# Patient Record
Sex: Female | Born: 1935 | Race: White | Hispanic: No | Smoking: Former smoker
Health system: Southern US, Community
[De-identification: ages and names within clinical notes are randomized; demographics above are authoritative.]

## PROBLEM LIST (undated history)

## (undated) DIAGNOSIS — H3530 Unspecified macular degeneration: Secondary | ICD-10-CM

## (undated) HISTORY — DX: Unspecified macular degeneration: H35.30

## (undated) HISTORY — PX: OTHER SURGICAL HISTORY: SHX169

---

## 1990-07-21 DIAGNOSIS — H3530 Unspecified macular degeneration: Secondary | ICD-10-CM

## 1990-07-21 HISTORY — DX: Unspecified macular degeneration: H35.30

## 2006-07-21 LAB — HM COLONOSCOPY

## 2010-07-21 LAB — HM MAMMOGRAPHY: HM Mammogram: NORMAL

## 2010-07-21 LAB — HM PAP SMEAR

## 2011-07-09 ENCOUNTER — Observation Stay: Payer: Self-pay | Admitting: Internal Medicine

## 2011-07-28 DIAGNOSIS — H353290 Exudative age-related macular degeneration, unspecified eye, stage unspecified: Secondary | ICD-10-CM | POA: Diagnosis not present

## 2011-08-01 ENCOUNTER — Encounter: Payer: Self-pay | Admitting: Internal Medicine

## 2011-08-01 ENCOUNTER — Ambulatory Visit (INDEPENDENT_AMBULATORY_CARE_PROVIDER_SITE_OTHER): Payer: Medicare Other | Admitting: Internal Medicine

## 2011-08-01 DIAGNOSIS — Z1211 Encounter for screening for malignant neoplasm of colon: Secondary | ICD-10-CM | POA: Diagnosis not present

## 2011-08-01 DIAGNOSIS — I10 Essential (primary) hypertension: Secondary | ICD-10-CM | POA: Diagnosis not present

## 2011-08-01 DIAGNOSIS — Z131 Encounter for screening for diabetes mellitus: Secondary | ICD-10-CM

## 2011-08-01 DIAGNOSIS — Z1239 Encounter for other screening for malignant neoplasm of breast: Secondary | ICD-10-CM

## 2011-08-01 DIAGNOSIS — H3530 Unspecified macular degeneration: Secondary | ICD-10-CM | POA: Insufficient documentation

## 2011-08-01 LAB — MICROALBUMIN / CREATININE URINE RATIO: Microalb, Ur: 1.8 mg/dL (ref 0.0–1.9)

## 2011-08-01 NOTE — Progress Notes (Signed)
Subjective:    Patient ID: Jo Burke, female    DOB: 09/30/1927, 78 y.o.   MRN: [id number]  HPI  Jo Burke is an 78 yo healthy white female recently moved here from [facility] (outside [place], [place]), who presents for primary care.  She was  recently evaluated in ED for  Bronchitis and found to be in hypertensive crisis and was admitted for IV meds.  She was discharged home 4 weeks ago,  Was treated also for bronchitis with steroids, antibiotics (azithromycin) and tussionex.  Seh contineus to feel worn out , which is frustrating to her since she is usually very energetic and active. but slowly getting better.   She has been getting her BP measured  at [facility] it has been labile, as  high as 150, but normalyl 100/60.   Has typically refused influenza bc of prior adverse reaction ("spent a month in bed). Has had the pneumonia vaccine over 13 years ago with repeat.  Prior to hospitalization she exercised regularly.  Past Medical History  Diagnosis Date  . Macular degeneration, left eye 1992    [place],    No current outpatient prescriptions on file prior to visit.   Review of Systems  Constitutional: Positive for fatigue. Negative for fever, chills, appetite change and unexpected weight change.  HENT: Negative for ear pain, congestion, sore throat, trouble swallowing, neck pain, voice change and sinus pressure.   Eyes: Negative for visual disturbance.  Respiratory: Negative for cough, shortness of breath, wheezing and stridor.   Cardiovascular: Negative for chest pain, palpitations and leg swelling.  Gastrointestinal: Negative for nausea, vomiting, abdominal pain, diarrhea, constipation, blood in stool, abdominal distention and anal bleeding.  Genitourinary: Negative for dysuria and flank pain.  Musculoskeletal: Negative for myalgias, arthralgias and gait problem.  Skin: Negative for color change and rash.  Neurological: Positive for weakness. Negative for dizziness  and headaches.  Hematological: Negative for adenopathy. Does not bruise/bleed easily.  Psychiatric/Behavioral: Negative for suicidal ideas, sleep disturbance and dysphoric mood. The patient is not nervous/anxious.        Objective:   Physical Exam  Constitutional: She is oriented to person, place, and time. She appears well-developed and well-nourished.  HENT:  Mouth/Throat: Oropharynx is clear and moist.  Eyes: EOM are normal. Pupils are equal, round, and reactive to light. No scleral icterus.  Neck: Normal range of motion. Neck supple. No JVD present. No thyromegaly present.  Cardiovascular: Normal rate, regular rhythm, normal heart sounds and intact distal pulses.   Pulmonary/Chest: Effort normal and breath sounds normal.  Abdominal: Soft. Bowel sounds are normal. She exhibits no mass. There is no tenderness.  Musculoskeletal: Normal range of motion. She exhibits no edema.  Lymphadenopathy:    She has no cervical adenopathy.  Neurological: She is alert and oriented to person, place, and time.  Skin: Skin is warm and dry.  Psychiatric: She has a normal mood and affect.    BP 158/72  Pulse 72  Temp(Src) 97.6 F (36.4 C) (Oral)  Wt 143 lb (64.864 kg)  SpO2 96%     Assessment & Plan:   Hypertension goal BP (blood pressure) < 140/80 Appears to be new onset, no prior history until less than one month ago.  Will ask her to submit log of weekly bps by [facility] and treat if the majority are > 140/80.  Will avoid hctz and beta blockers given her active lifestyle.   Screening for colon cancer Screening colonoscopy was done at  age 34 and normal.  Will continue annual FOBTS as screenign.   Screening for breast cancer Her last mammogram was Nov 2012 and normal.  Will continue to repeat annually     Updated Medication List No outpatient encounter prescriptions on file as of 08/01/2011.

## 2011-08-01 NOTE — Patient Instructions (Signed)
For frequent constipation, it is oK to take Miralax, Metamucil,  Citrucel or Fibercon daily

## 2011-08-03 ENCOUNTER — Encounter: Payer: Self-pay | Admitting: Internal Medicine

## 2011-08-03 DIAGNOSIS — I10 Essential (primary) hypertension: Secondary | ICD-10-CM | POA: Insufficient documentation

## 2011-08-03 NOTE — Assessment & Plan Note (Signed)
Appears to be new onset, no prior history until less than one month ago.  Will ask her to submit log of weekly bps by [facility] and treat if the majority are > 140/80.  Will avoid hctz and beta blockers given her active lifestyle.

## 2011-08-03 NOTE — Assessment & Plan Note (Signed)
Screening colonoscopy was done at age 78 and normal.  Will continue annual FOBTS as screenign.

## 2011-08-03 NOTE — Assessment & Plan Note (Signed)
Her last mammogram was Nov 2012 and normal.  Will continue to repeat annually

## 2011-09-01 DIAGNOSIS — H353290 Exudative age-related macular degeneration, unspecified eye, stage unspecified: Secondary | ICD-10-CM | POA: Diagnosis not present

## 2011-10-06 DIAGNOSIS — H353290 Exudative age-related macular degeneration, unspecified eye, stage unspecified: Secondary | ICD-10-CM | POA: Diagnosis not present

## 2011-11-14 DIAGNOSIS — H353290 Exudative age-related macular degeneration, unspecified eye, stage unspecified: Secondary | ICD-10-CM | POA: Diagnosis not present

## 2011-12-12 DIAGNOSIS — B351 Tinea unguium: Secondary | ICD-10-CM | POA: Diagnosis not present

## 2011-12-12 DIAGNOSIS — M2040 Other hammer toe(s) (acquired), unspecified foot: Secondary | ICD-10-CM | POA: Diagnosis not present

## 2011-12-22 DIAGNOSIS — H353290 Exudative age-related macular degeneration, unspecified eye, stage unspecified: Secondary | ICD-10-CM | POA: Diagnosis not present

## 2012-01-05 DIAGNOSIS — H169 Unspecified keratitis: Secondary | ICD-10-CM | POA: Diagnosis not present

## 2012-01-12 DIAGNOSIS — H169 Unspecified keratitis: Secondary | ICD-10-CM | POA: Diagnosis not present

## 2012-01-20 DIAGNOSIS — H169 Unspecified keratitis: Secondary | ICD-10-CM | POA: Diagnosis not present

## 2012-02-17 DIAGNOSIS — H3530 Unspecified macular degeneration: Secondary | ICD-10-CM | POA: Diagnosis not present

## 2012-02-22 LAB — HM MAMMOGRAPHY: HM MAMMO: NORMAL

## 2012-02-26 ENCOUNTER — Telehealth: Payer: Self-pay | Admitting: Internal Medicine

## 2012-02-26 DIAGNOSIS — Z1239 Encounter for other screening for malignant neoplasm of breast: Secondary | ICD-10-CM

## 2012-02-26 NOTE — Telephone Encounter (Signed)
Pt came in today to get a mammogram appointment Pt used to go to [facility]  Stated [place] imagine would be ok Pt would like Monday pm appointment

## 2012-02-27 NOTE — Telephone Encounter (Signed)
Order in EPIC 

## 2012-03-04 NOTE — Telephone Encounter (Signed)
Notified patient of her appointment with [facility] she is going to change it to a time that is convenient for her.

## 2012-03-16 DIAGNOSIS — Z1231 Encounter for screening mammogram for malignant neoplasm of breast: Secondary | ICD-10-CM | POA: Diagnosis not present

## 2012-03-25 ENCOUNTER — Encounter: Payer: Self-pay | Admitting: Internal Medicine

## 2012-04-02 DIAGNOSIS — H43819 Vitreous degeneration, unspecified eye: Secondary | ICD-10-CM | POA: Diagnosis not present

## 2012-04-02 DIAGNOSIS — H353290 Exudative age-related macular degeneration, unspecified eye, stage unspecified: Secondary | ICD-10-CM | POA: Diagnosis not present

## 2012-05-14 DIAGNOSIS — H353290 Exudative age-related macular degeneration, unspecified eye, stage unspecified: Secondary | ICD-10-CM | POA: Diagnosis not present

## 2012-06-07 DIAGNOSIS — Z23 Encounter for immunization: Secondary | ICD-10-CM | POA: Diagnosis not present

## 2012-06-23 ENCOUNTER — Ambulatory Visit (INDEPENDENT_AMBULATORY_CARE_PROVIDER_SITE_OTHER): Payer: Medicare Other | Admitting: Internal Medicine

## 2012-06-23 ENCOUNTER — Other Ambulatory Visit: Payer: Self-pay

## 2012-06-23 ENCOUNTER — Encounter: Payer: Self-pay | Admitting: Internal Medicine

## 2012-06-23 VITALS — BP 152/70 | HR 65 | Temp 98.1°F | Resp 12 | Ht 61.5 in | Wt 149.5 lb

## 2012-06-23 DIAGNOSIS — E559 Vitamin D deficiency, unspecified: Secondary | ICD-10-CM

## 2012-06-23 DIAGNOSIS — R5381 Other malaise: Secondary | ICD-10-CM

## 2012-06-23 DIAGNOSIS — Z13220 Encounter for screening for lipoid disorders: Secondary | ICD-10-CM

## 2012-06-23 DIAGNOSIS — R32 Unspecified urinary incontinence: Secondary | ICD-10-CM

## 2012-06-23 DIAGNOSIS — Z1331 Encounter for screening for depression: Secondary | ICD-10-CM

## 2012-06-23 DIAGNOSIS — E785 Hyperlipidemia, unspecified: Secondary | ICD-10-CM

## 2012-06-23 DIAGNOSIS — R350 Frequency of micturition: Secondary | ICD-10-CM

## 2012-06-23 DIAGNOSIS — Z0000 Encounter for general adult medical examination without abnormal findings: Secondary | ICD-10-CM | POA: Diagnosis not present

## 2012-06-23 DIAGNOSIS — I10 Essential (primary) hypertension: Secondary | ICD-10-CM | POA: Diagnosis not present

## 2012-06-23 DIAGNOSIS — R5383 Other fatigue: Secondary | ICD-10-CM | POA: Diagnosis not present

## 2012-06-23 DIAGNOSIS — Z124 Encounter for screening for malignant neoplasm of cervix: Secondary | ICD-10-CM

## 2012-06-23 LAB — LIPID PANEL
Total CHOL/HDL Ratio: 4
VLDL: 16.4 mg/dL (ref 0.0–40.0)

## 2012-06-23 LAB — CBC WITH DIFFERENTIAL/PLATELET
Basophils Absolute: 0 10*3/uL (ref 0.0–0.1)
Basophils Relative: 0.6 % (ref 0.0–3.0)
Eosinophils Relative: 3.3 % (ref 0.0–5.0)
HCT: 39.8 % (ref 36.0–46.0)
Hemoglobin: 13 g/dL (ref 12.0–15.0)
Lymphocytes Relative: 28.7 % (ref 12.0–46.0)
Lymphs Abs: 1.4 10*3/uL (ref 0.7–4.0)
Monocytes Relative: 11.6 % (ref 3.0–12.0)
Neutro Abs: 2.7 10*3/uL (ref 1.4–7.7)
RBC: 4.45 Mil/uL (ref 3.87–5.11)
RDW: 14.7 % — ABNORMAL HIGH (ref 11.5–14.6)
WBC: 4.8 10*3/uL (ref 4.5–10.5)

## 2012-06-23 LAB — POCT URINALYSIS DIPSTICK
Ketones, UA: NEGATIVE
Protein, UA: NEGATIVE
pH, UA: 5.5

## 2012-06-23 LAB — COMPREHENSIVE METABOLIC PANEL
ALT: 19 U/L (ref 0–35)
CO2: 25 mEq/L (ref 19–32)
Calcium: 9.1 mg/dL (ref 8.4–10.5)
Chloride: 105 mEq/L (ref 96–112)
Creatinine, Ser: 0.9 mg/dL (ref 0.4–1.2)
GFR: 63.65 mL/min (ref >60.00)
Glucose, Bld: 83 mg/dL (ref 70–99)

## 2012-06-23 LAB — TSH: TSH: 0.97 u[IU]/mL (ref 0.35–5.50)

## 2012-06-23 MED ORDER — TETANUS-DIPHTH-ACELL PERTUSSIS 5-2.5-18.5 LF-MCG/0.5 IM SUSP: 0.5000 mL | Freq: Once | INTRAMUSCULAR | Status: DC

## 2012-06-23 MED ORDER — DARIFENACIN HYDROBROMIDE ER 7.5 MG PO TB24: 7.5000 mg | ORAL_TABLET | Freq: Every day | ORAL | Status: DC

## 2012-06-23 NOTE — Progress Notes (Addendum)
Patient ID: Jo Burke, female   DOB: 11-Nov-1927, 78 y.o.   MRN: [id number]  The patient is here for annual Medicare wellness examination and management of other chronic and acute problems.   The risk factors are reflected in the social history.  The roster of all physicians providing medical care to patient - is listed in the Snapshot section of the chart.  Activities of daily living:  The patient is 100% independent in all ADLs: dressing, toileting, feeding as well as independent mobility  Home safety : The patient has smoke detectors in the home. They wear seatbelts.  There are no firearms at home. There is no violence in the home.   There is no risks for hepatitis, STDs or HIV. There is no   history of blood transfusion. They have no travel history to infectious disease endemic areas of the world.  The patient has seen their dentist in the last six month. They have seen their eye doctor in the last year. They admit to slight hearing difficulty with regard to whispered voices and some television programs.  They have deferred audiologic testing in the last year.  They do not  have excessive sun exposure. Discussed the need for sun protection: hats, long sleeves and use of sunscreen if there is significant sun exposure.   Diet: the importance of a healthy diet is discussed. They do have a healthy diet.  The benefits of regular aerobic exercise were discussed. She walks 4 times per week ,  20 minutes.   Depression screen: there are no signs or vegative symptoms of depression- irritability, change in appetite, anhedonia, sadness/tearfullness.  Cognitive assessment: the patient manages all their financial and personal affairs and is actively engaged. They could relate day,date,year and events; recalled 2/3 objects at 3 minutes; performed clock-face test normally.  The following portions of the patient's history were reviewed and updated as appropriate: allergies, current medications, past  family history, past medical history,  past surgical history, past social history  and problem list.  Visual acuity was not assessed per patient preference since she has regular follow up with her ophthalmologist. Hearing and body mass index were assessed and reviewed.   During the course of the visit the patient was educated and counseled about appropriate screening and preventive services including : fall prevention , diabetes screening, nutrition counseling, colorectal cancer screening, and recommended immunizations.    Objective  BP 152/70  Pulse 65  Temp 98.1 F (36.7 C) (Oral)  Resp 12  Ht 5' 1.5" (1.562 m)  Wt 149 lb 8 oz (67.813 kg)  BMI 27.79 kg/m2  SpO2 95%  General Appearance:    Alert, cooperative, no distress, appears stated age  Head:    Normocephalic, without obvious abnormality, atraumatic  Eyes:    PERRL, conjunctiva/corneas clear, EOM's intact, fundi    benign, both eyes  Ears:    Normal TM's and external ear canals, both ears  Nose:   Nares normal, septum midline, mucosa normal, no drainage    or sinus tenderness  Throat:   Lips, mucosa, and tongue normal; teeth and gums normal  Neck:   Supple, symmetrical, trachea midline, no adenopathy;    thyroid:  no enlargement/tenderness/nodules; no carotid   bruit or JVD  Back:     Symmetric, no curvature, ROM normal, no CVA tenderness  Lungs:     Clear to auscultation bilaterally, respirations unlabored  Chest Wall:    No tenderness or deformity   Heart:    Regular  rate and rhythm, S1 and S2 normal, no murmur, rub   or gallop  Breast Exam:    No tenderness, masses, or nipple abnormality  Abdomen:     Soft, non-tender, bowel sounds active all four quadrants,    no masses, no organomegaly  Genitalia:    Normal female without lesion, discharge or tenderness  Extremities:   Extremities normal, atraumatic, no cyanosis or edema  Pulses:   2+ and symmetric all extremities  Skin:   Skin color, texture, turgor normal, no  rashes or lesions  Lymph nodes:   Cervical, supraclavicular, and axillary nodes normal  Neurologic:   CNII-XII intact, normal strength, sensation and reflexes    throughout    Assessment and Plan  Routine general medical examination at a health care facility Breast and pelvic exams were done today and WNL.   Hypertension goal BP (blood pressure) < 140/80 Well controlled historically without medications,  Given her age.   EKG normal except for mild LAE suggested.   Other and unspecified hyperlipidemia She has simply hyperlipidemia, LDl of 200, with no history of DM or CAD .  Will discuss statin therapy with her,  Trial of pravastatin  And baby aspirin.Marland Kitchen  Urinary incontinence in female No UTI by current testing,  Trial of enablex.  Samples give,  Side effects discussed.   Vitamin D deficiency Low at  89.  Rx Drisdol for winter months    Updated Medication List Outpatient Encounter Prescriptions as of 06/23/2012  Medication Sig Dispense Refill  . aspirin 81 MG EC tablet Take 1 tablet (81 mg total) by mouth daily. Swallow whole.  30 tablet  12  . darifenacin (ENABLEX) 7.5 MG 24 hr tablet Take 1 tablet (7.5 mg total) by mouth daily.  14 tablet  0  . ergocalciferol (DRISDOL) 50000 UNITS capsule Take 1 capsule (50,000 Units total) by mouth once a week.  4 capsule  3  . pravastatin (PRAVACHOL) 20 MG tablet Take 1 tablet (20 mg total) by mouth daily.  90 tablet  3  . TDaP (BOOSTRIX) 5-2.5-18.5 LF-MCG/0.5 injection Inject 0.5 mLs into the muscle once.  0.5 mL  0

## 2012-06-23 NOTE — Patient Instructions (Addendum)
Your exam was normal  I recommend you get the TDap vaccine at one of the local pharmacies   I gave you samples of enablex,  Take one tablet daily for bladder problems.  We will call  you with the results of your labs

## 2012-06-25 ENCOUNTER — Encounter: Payer: Self-pay | Admitting: Internal Medicine

## 2012-06-25 DIAGNOSIS — E785 Hyperlipidemia, unspecified: Secondary | ICD-10-CM | POA: Insufficient documentation

## 2012-06-25 DIAGNOSIS — E559 Vitamin D deficiency, unspecified: Secondary | ICD-10-CM | POA: Insufficient documentation

## 2012-06-25 DIAGNOSIS — R32 Unspecified urinary incontinence: Secondary | ICD-10-CM | POA: Insufficient documentation

## 2012-06-25 MED ORDER — ASPIRIN 81 MG PO TBEC: 81.0000 mg | DELAYED_RELEASE_TABLET | Freq: Every day | ORAL | Status: DC

## 2012-06-25 MED ORDER — ERGOCALCIFEROL 1.25 MG (50000 UT) PO CAPS: 50000.0000 [IU] | ORAL_CAPSULE | ORAL | Status: DC

## 2012-06-25 MED ORDER — PRAVASTATIN SODIUM 20 MG PO TABS: 20.0000 mg | ORAL_TABLET | Freq: Every day | ORAL | Status: DC

## 2012-06-25 NOTE — Assessment & Plan Note (Signed)
Low at  21.  Rx Drisdol for winter months

## 2012-06-25 NOTE — Addendum Note (Signed)
Addended by: Sherlene Shams on: 06/25/2012 05:49 AM   Modules accepted: Orders

## 2012-06-25 NOTE — Assessment & Plan Note (Signed)
No UTI by current testing,  Trial of enablex.  Samples give,  Side effects discussed.

## 2012-06-25 NOTE — Assessment & Plan Note (Addendum)
She has simply hyperlipidemia, LDl of 200, with no history of DM or CAD .  Will discuss statin therapy with her,  Trial of pravastatin  And baby aspirin.Marland Kitchen

## 2012-06-25 NOTE — Assessment & Plan Note (Addendum)
Well controlled historically without medications,  Given her age.   EKG normal except for mild LAE suggested.

## 2012-06-25 NOTE — Assessment & Plan Note (Signed)
Breast and pelvic exams were done today and WNL.

## 2012-06-28 DIAGNOSIS — H353290 Exudative age-related macular degeneration, unspecified eye, stage unspecified: Secondary | ICD-10-CM | POA: Diagnosis not present

## 2012-08-09 DIAGNOSIS — H353290 Exudative age-related macular degeneration, unspecified eye, stage unspecified: Secondary | ICD-10-CM | POA: Diagnosis not present

## 2012-08-11 ENCOUNTER — Telehealth: Payer: Self-pay | Admitting: General Practice

## 2012-08-11 NOTE — Telephone Encounter (Signed)
Pt wants to primidone. Pt states that the tremors of her hands is getting to the point where she needs a medication. Years ago the neurologist had diagnosed her and stated this would be a good medicine. Pt wants your opinion of medication.

## 2012-08-12 NOTE — Telephone Encounter (Signed)
pretty safe, well tolerated.  Worth a try

## 2012-08-13 NOTE — Telephone Encounter (Signed)
Called pt and left message informing her

## 2012-08-13 NOTE — Telephone Encounter (Signed)
i have never evaluated her for tremor and cannot prescribe it as requested until I see her. .  Asking for my opinion is different than asking me to prescribe it. Neither of my tow office notes reference a tremor so I would be liable if she had an adverse reaction to it, since I have not documented a tremor.

## 2012-08-13 NOTE — Telephone Encounter (Signed)
Pt would like this medication sent to [facility] in [place] on [address].

## 2012-09-05 ENCOUNTER — Other Ambulatory Visit: Payer: Self-pay

## 2012-09-09 ENCOUNTER — Telehealth: Payer: Self-pay | Admitting: Internal Medicine

## 2012-09-09 MED ORDER — BENZONATATE 100 MG PO CAPS: 100.0000 mg | ORAL_CAPSULE | Freq: Two times a day (BID) | ORAL | Status: DC | PRN

## 2012-09-09 NOTE — Telephone Encounter (Signed)
Jo Burke, Independent Living Nurse at [facility] states Pt came in yesterday complaining of hacky cough that has been there for about two weeks. She is not running a fever, she is not SOB or anything, lung sounds clear.  When she does cough up stuff it is mostly white.  Jo Burke can't see reason for pt to be on antibiotics at this point but wants to let Dr. Darrick Huntsman know she is going to take cepacol lozenges.  Wondering if Dr. Darrick Huntsman would recommend anything for Jo Burke.

## 2012-09-09 NOTE — Telephone Encounter (Signed)
Tessalon sent to pharmacy   For cough

## 2012-09-10 NOTE — Telephone Encounter (Signed)
Pt and [place] Notified.

## 2012-09-19 DIAGNOSIS — H353290 Exudative age-related macular degeneration, unspecified eye, stage unspecified: Secondary | ICD-10-CM | POA: Diagnosis not present

## 2012-09-20 DIAGNOSIS — H353290 Exudative age-related macular degeneration, unspecified eye, stage unspecified: Secondary | ICD-10-CM | POA: Diagnosis not present

## 2012-11-01 DIAGNOSIS — H353290 Exudative age-related macular degeneration, unspecified eye, stage unspecified: Secondary | ICD-10-CM | POA: Diagnosis not present

## 2012-12-06 DIAGNOSIS — H353290 Exudative age-related macular degeneration, unspecified eye, stage unspecified: Secondary | ICD-10-CM | POA: Diagnosis not present

## 2012-12-29 DIAGNOSIS — H353290 Exudative age-related macular degeneration, unspecified eye, stage unspecified: Secondary | ICD-10-CM | POA: Diagnosis not present

## 2013-02-21 DIAGNOSIS — H353290 Exudative age-related macular degeneration, unspecified eye, stage unspecified: Secondary | ICD-10-CM | POA: Diagnosis not present

## 2013-02-23 ENCOUNTER — Other Ambulatory Visit: Payer: Self-pay

## 2013-03-28 DIAGNOSIS — H353290 Exudative age-related macular degeneration, unspecified eye, stage unspecified: Secondary | ICD-10-CM | POA: Diagnosis not present

## 2013-05-16 DIAGNOSIS — H353190 Nonexudative age-related macular degeneration, unspecified eye, stage unspecified: Secondary | ICD-10-CM | POA: Diagnosis not present

## 2013-05-26 ENCOUNTER — Other Ambulatory Visit: Payer: Self-pay

## 2013-06-19 ENCOUNTER — Other Ambulatory Visit: Payer: Self-pay | Admitting: Internal Medicine

## 2013-06-30 DIAGNOSIS — C44319 Basal cell carcinoma of skin of other parts of face: Secondary | ICD-10-CM | POA: Diagnosis not present

## 2013-07-11 ENCOUNTER — Telehealth: Payer: Self-pay | Admitting: Internal Medicine

## 2013-07-11 NOTE — Telephone Encounter (Signed)
Needs to be seen

## 2013-07-11 NOTE — Telephone Encounter (Signed)
The patient thinks she is coming down with the flu and she is wanting a prescription for Tamiflu.

## 2013-07-12 ENCOUNTER — Telehealth: Payer: Self-pay | Admitting: *Deleted

## 2013-07-12 NOTE — Telephone Encounter (Signed)
Dr. Melina Schools nurse Olegario Messier voicemail also checked and there was no message from patient on her voicemail either. Not sure what message or voicemail patient was speaking of.

## 2013-07-12 NOTE — Telephone Encounter (Signed)
Patient walked in without an appointment after declining appointment time this morning per our conversation. Patient came in the office demanding to be seen however no one had any openings to see patient. I returned patient's call this morning to offer her an appointment for today with Dr. Dan Humphreys per our previous phone conversation. She stated she wanted to check with the nurse at her facility first before coming in to be seen. Per patient she called back but did not speak to anyone and was waiting for someone to call her back to schedule the appointment. I had  no messages on my voicemail from this patient. Explained to patient if she was calling to get an appointment then she should have spoken to someone. Patient was very loud, rude and argumentative while in the office. Informed her she can be seen tomorrow just schedule an appointment with the front desk before leaving.

## 2013-07-12 NOTE — Telephone Encounter (Signed)
Offered patient an appointment to be seen today, she declined. Stated she would call back

## 2013-07-13 ENCOUNTER — Ambulatory Visit: Payer: Medicare Other | Admitting: Adult Health

## 2013-07-13 ENCOUNTER — Encounter: Payer: Self-pay | Admitting: Internal Medicine

## 2013-07-13 ENCOUNTER — Ambulatory Visit (INDEPENDENT_AMBULATORY_CARE_PROVIDER_SITE_OTHER): Payer: Medicare Other | Admitting: Internal Medicine

## 2013-07-13 VITALS — BP 158/80 | HR 66 | Temp 97.6°F | Wt 140.0 lb

## 2013-07-13 DIAGNOSIS — E559 Vitamin D deficiency, unspecified: Secondary | ICD-10-CM | POA: Diagnosis not present

## 2013-07-13 DIAGNOSIS — J069 Acute upper respiratory infection, unspecified: Secondary | ICD-10-CM | POA: Diagnosis not present

## 2013-07-13 LAB — CBC WITH DIFFERENTIAL/PLATELET
Basophils Absolute: 0 10*3/uL (ref 0.0–0.1)
Eosinophils Absolute: 0.2 10*3/uL (ref 0.0–0.7)
Eosinophils Relative: 3.3 % (ref 0.0–5.0)
HCT: 40.1 % (ref 36.0–46.0)
Lymphocytes Relative: 31.2 % (ref 12.0–46.0)
Lymphs Abs: 1.6 10*3/uL (ref 0.7–4.0)
MCHC: 33.5 g/dL (ref 30.0–36.0)
MCV: 88.7 fl (ref 78.0–100.0)
Monocytes Absolute: 0.5 10*3/uL (ref 0.1–1.0)
Neutrophils Relative %: 54.7 % (ref 43.0–77.0)
Platelets: 240 10*3/uL (ref 150.0–400.0)
RDW: 14.6 % (ref 11.5–14.6)

## 2013-07-13 LAB — COMPREHENSIVE METABOLIC PANEL
ALT: 18 U/L (ref 0–35)
AST: 19 U/L (ref 0–37)
Albumin: 4.1 g/dL (ref 3.5–5.2)
Alkaline Phosphatase: 64 U/L (ref 39–117)
GFR: 73.79 mL/min (ref >60.00)
Potassium: 4.2 mEq/L (ref 3.5–5.1)
Sodium: 138 mEq/L (ref 135–145)
Total Protein: 7 g/dL (ref 6.0–8.3)

## 2013-07-13 LAB — POCT INFLUENZA A/B: Influenza A, POC: NEGATIVE

## 2013-07-13 NOTE — Assessment & Plan Note (Signed)
Will recheck Vit D level with labs today. 

## 2013-07-13 NOTE — Progress Notes (Signed)
Pre-visit discussion using our clinic review tool. No additional management support is needed unless otherwise documented below in the visit note.  

## 2013-07-13 NOTE — Patient Instructions (Signed)

## 2013-07-13 NOTE — Progress Notes (Signed)
   Subjective:    Patient ID: Jo Burke, female    DOB: March 11, 1928, 79 y.o.   MRN: [id number]  HPI 79YO female presents for acute visit with headache, fatigue, general malaise x 3-4 days. No fever, chills. BP yesterday 118/60. No nasal congestion, sore throat. Occasional dry cough, very minimal. Did not have flu shot this year.  Outpatient Encounter Prescriptions as of 07/13/2013  Medication Sig  . naproxen sodium (ANAPROX) 220 MG tablet Take 220 mg by mouth 2 (two) times daily with a meal.  . aspirin 81 MG EC tablet Take 1 tablet (81 mg total) by mouth daily. Swallow whole.  . darifenacin (ENABLEX) 7.5 MG 24 hr tablet Take 1 tablet (7.5 mg total) by mouth daily.  . ergocalciferol (DRISDOL) 50000 UNITS capsule Take 1 capsule (50,000 Units total) by mouth once a week.  . pravastatin (PRAVACHOL) 20 MG tablet Take 1 tablet (20 mg total) by mouth daily.   BP 158/80  Pulse 66  Temp(Src) 97.6 F (36.4 C) (Oral)  Wt 140 lb (63.504 kg)  SpO2 96%    Review of Systems  Constitutional: Positive for fatigue. Negative for fever, chills and unexpected weight change.  HENT: Negative for congestion, ear discharge, ear pain, facial swelling, hearing loss, mouth sores, nosebleeds, postnasal drip, rhinorrhea, sinus pressure, sneezing, sore throat, tinnitus, trouble swallowing and voice change.   Eyes: Negative for pain, discharge, redness and visual disturbance.  Respiratory: Positive for cough. Negative for chest tightness, shortness of breath, wheezing and stridor.   Cardiovascular: Negative for chest pain, palpitations and leg swelling.  Musculoskeletal: Negative for arthralgias, myalgias, neck pain and neck stiffness.  Skin: Negative for color change and rash.  Neurological: Negative for dizziness, weakness, light-headedness and headaches.  Hematological: Negative for adenopathy.       Objective:   Physical Exam  Constitutional: She is oriented to person, place, and time. She appears  well-developed and well-nourished. No distress.  HENT:  Head: Normocephalic and atraumatic.  Right Ear: External ear normal.  Left Ear: External ear normal.  Nose: Nose normal.  Mouth/Throat: Oropharynx is clear and moist. No oropharyngeal exudate.  Eyes: Conjunctivae are normal. Pupils are equal, round, and reactive to light. Right eye exhibits no discharge. Left eye exhibits no discharge. No scleral icterus.  Neck: Normal range of motion. Neck supple. No tracheal deviation present. No thyromegaly present.  Cardiovascular: Normal rate, regular rhythm, normal heart sounds and intact distal pulses.  Exam reveals no gallop and no friction rub.   No murmur heard. Pulmonary/Chest: Effort normal and breath sounds normal. No accessory muscle usage. Not tachypneic. No respiratory distress. She has no decreased breath sounds. She has no wheezes. She has no rhonchi. She has no rales. She exhibits no tenderness.  Musculoskeletal: Normal range of motion. She exhibits no edema and no tenderness.  Lymphadenopathy:    She has no cervical adenopathy.  Neurological: She is alert and oriented to person, place, and time. No cranial nerve deficit. She exhibits normal muscle tone. Coordination normal.  Skin: Skin is warm and dry. No rash noted. She is not diaphoretic. No erythema. No pallor.  Psychiatric: She has a normal mood and affect. Her behavior is normal. Judgment and thought content normal.          Assessment & Plan:

## 2013-07-13 NOTE — Assessment & Plan Note (Signed)
Symptoms most consistent with viral URI. Rapid flu negative. Encouraged rest, adequate fluid intake. Tylenol prn fever or pain. Follow up if symptoms not improving or if any worsening fever, chills, cough, shortness of breath.

## 2013-07-25 ENCOUNTER — Telehealth: Payer: Self-pay | Admitting: *Deleted

## 2013-07-25 NOTE — Telephone Encounter (Signed)
All labs are normal except vitamin d is slightly low, pleaseincrease vitmain d supplement to 2000 units daily for the winter months.   print out and mail labs to her

## 2013-07-25 NOTE — Telephone Encounter (Signed)
Patient left a message on voicemail that she is not able to review her labs on my chart and wants the results called to her. Please advise.

## 2013-07-26 NOTE — Telephone Encounter (Signed)
Patient notified of results.

## 2013-07-26 NOTE — Telephone Encounter (Signed)
Left message for patient to return my call.

## 2013-07-27 ENCOUNTER — Encounter: Payer: Medicare Other | Admitting: Internal Medicine

## 2013-08-01 ENCOUNTER — Telehealth: Payer: Self-pay | Admitting: Internal Medicine

## 2013-08-01 NOTE — Telephone Encounter (Signed)
The patient is wanting her recent labs mailed to her home. She has deactivated her my chart

## 2013-08-02 NOTE — Telephone Encounter (Signed)
Mailed results as requested.

## 2013-08-15 DIAGNOSIS — H353290 Exudative age-related macular degeneration, unspecified eye, stage unspecified: Secondary | ICD-10-CM | POA: Diagnosis not present

## 2013-09-26 DIAGNOSIS — C44319 Basal cell carcinoma of skin of other parts of face: Secondary | ICD-10-CM | POA: Diagnosis not present

## 2013-09-26 DIAGNOSIS — Z85828 Personal history of other malignant neoplasm of skin: Secondary | ICD-10-CM | POA: Insufficient documentation

## 2013-11-07 ENCOUNTER — Telehealth: Payer: Self-pay | Admitting: Internal Medicine

## 2013-11-07 NOTE — Telephone Encounter (Signed)
I have not seen this patient since December 2013.  She must be seen before an referral for PT can be done.

## 2013-11-07 NOTE — Telephone Encounter (Signed)
Left message for patient to return call to office. 

## 2013-11-07 NOTE — Telephone Encounter (Signed)
Please advise 

## 2013-11-07 NOTE — Telephone Encounter (Signed)
The patient is needing a prescription sent to  [facility] to work on her sciatic nerve.

## 2013-11-08 NOTE — Telephone Encounter (Signed)
Jo Burke scheduled for 11/18/13 for referral to PT.

## 2013-11-14 DIAGNOSIS — H353290 Exudative age-related macular degeneration, unspecified eye, stage unspecified: Secondary | ICD-10-CM | POA: Diagnosis not present

## 2013-11-18 ENCOUNTER — Ambulatory Visit: Payer: Medicare Other | Admitting: Internal Medicine

## 2013-11-30 DIAGNOSIS — H3530 Unspecified macular degeneration: Secondary | ICD-10-CM | POA: Diagnosis not present

## 2013-12-08 ENCOUNTER — Encounter: Payer: Self-pay | Admitting: Internal Medicine

## 2013-12-21 ENCOUNTER — Telehealth: Payer: Self-pay

## 2013-12-21 ENCOUNTER — Encounter: Payer: Self-pay | Admitting: Internal Medicine

## 2013-12-21 ENCOUNTER — Ambulatory Visit (INDEPENDENT_AMBULATORY_CARE_PROVIDER_SITE_OTHER): Payer: Medicare Other | Admitting: Internal Medicine

## 2013-12-21 VITALS — BP 112/62 | HR 70 | Temp 97.9°F | Resp 16 | Ht 61.0 in | Wt 138.5 lb

## 2013-12-21 DIAGNOSIS — I10 Essential (primary) hypertension: Secondary | ICD-10-CM

## 2013-12-21 DIAGNOSIS — R5381 Other malaise: Secondary | ICD-10-CM | POA: Diagnosis not present

## 2013-12-21 DIAGNOSIS — R498 Other voice and resonance disorders: Secondary | ICD-10-CM | POA: Diagnosis not present

## 2013-12-21 DIAGNOSIS — Z0000 Encounter for general adult medical examination without abnormal findings: Secondary | ICD-10-CM

## 2013-12-21 DIAGNOSIS — Z1211 Encounter for screening for malignant neoplasm of colon: Secondary | ICD-10-CM

## 2013-12-21 DIAGNOSIS — E559 Vitamin D deficiency, unspecified: Secondary | ICD-10-CM | POA: Diagnosis not present

## 2013-12-21 DIAGNOSIS — Z23 Encounter for immunization: Secondary | ICD-10-CM

## 2013-12-21 DIAGNOSIS — R490 Dysphonia: Secondary | ICD-10-CM

## 2013-12-21 DIAGNOSIS — R5383 Other fatigue: Secondary | ICD-10-CM | POA: Diagnosis not present

## 2013-12-21 DIAGNOSIS — Z1239 Encounter for other screening for malignant neoplasm of breast: Secondary | ICD-10-CM

## 2013-12-21 DIAGNOSIS — H3530 Unspecified macular degeneration: Secondary | ICD-10-CM

## 2013-12-21 DIAGNOSIS — R499 Unspecified voice and resonance disorder: Secondary | ICD-10-CM

## 2013-12-21 DIAGNOSIS — E785 Hyperlipidemia, unspecified: Secondary | ICD-10-CM

## 2013-12-21 LAB — COMPREHENSIVE METABOLIC PANEL
ALT: 17 U/L (ref 0–35)
AST: 19 U/L (ref 0–37)
Albumin: 3.8 g/dL (ref 3.5–5.2)
Alkaline Phosphatase: 58 U/L (ref 39–117)
BILIRUBIN TOTAL: 1 mg/dL (ref 0.2–1.2)
BUN: 23 mg/dL (ref 6–23)
CO2: 28 mEq/L (ref 19–32)
Calcium: 9.7 mg/dL (ref 8.4–10.5)
Chloride: 103 mEq/L (ref 96–112)
Creatinine, Ser: 0.8 mg/dL (ref 0.4–1.2)
GFR: 74.81 mL/min (ref >60.00)
Glucose, Bld: 82 mg/dL (ref 70–99)
Potassium: 4.7 mEq/L (ref 3.5–5.1)
Sodium: 137 mEq/L (ref 135–145)
TOTAL PROTEIN: 6.6 g/dL (ref 6.0–8.3)

## 2013-12-21 LAB — CBC WITH DIFFERENTIAL/PLATELET
Basophils Absolute: 0 10*3/uL (ref 0.0–0.1)
Basophils Relative: 0.4 % (ref 0.0–3.0)
EOS PCT: 2.7 % (ref 0.0–5.0)
Eosinophils Absolute: 0.2 10*3/uL (ref 0.0–0.7)
HEMATOCRIT: 40.7 % (ref 36.0–46.0)
Hemoglobin: 13.5 g/dL (ref 12.0–15.0)
LYMPHS ABS: 1.4 10*3/uL (ref 0.7–4.0)
Lymphocytes Relative: 23.8 % (ref 12.0–46.0)
MCHC: 33.2 g/dL (ref 30.0–36.0)
MCV: 90.2 fl (ref 78.0–100.0)
MONO ABS: 0.5 10*3/uL (ref 0.1–1.0)
Monocytes Relative: 9 % (ref 3.0–12.0)
Neutro Abs: 3.8 10*3/uL (ref 1.4–7.7)
Neutrophils Relative %: 64.1 % (ref 43.0–77.0)
PLATELETS: 226 10*3/uL (ref 150.0–400.0)
RBC: 4.52 Mil/uL (ref 3.87–5.11)
RDW: 14 % (ref 11.5–15.5)
WBC: 5.9 10*3/uL (ref 4.0–10.5)

## 2013-12-21 MED ORDER — TETANUS-DIPHTH-ACELL PERTUSSIS 5-2.5-18.5 LF-MCG/0.5 IM SUSP: 0.5000 mL | Freq: Once | INTRAMUSCULAR | Status: DC

## 2013-12-21 NOTE — Progress Notes (Signed)
Patient ID: Jo Burke, female   DOB: 1928/03/06, 80 y.o.   MRN: [id number]  The patient is here for annual Medicare wellness examination and management of other chronic and acute problems.  She has been less [place] physically and socially because her husband Jo Burke has dementia,  Although he is still functional.    She is having insomnia due to anxiety.  Falls asleep in front of TV, but when she goes to bed she can't sleep.  She is  averaging 5 hours.,  Does not nap during the day, and does not want medication.   Recurrent episodes of sciatica have been  relieved with quinine and ibuprofen    The risk factors are reflected in the social history.  The roster of all physicians providing medical care to patient - is listed in the Snapshot section of the chart.  Activities of daily living:  The patient is 100% independent in all ADLs: dressing, toileting, feeding as well as independent mobility  Home safety : The patient has smoke detectors in the home. They wear seatbelts.  There are no firearms at home. There is no violence in the home.   There is no risks for hepatitis, STDs or HIV. There is no   history of blood transfusion. They have no travel history to infectious disease endemic areas of the world.  The patient has seen their dentist in the last six month. They have seen their eye doctor in the last year. They admit to slight hearing difficulty with regard to whispered voices and some television programs.  They have deferred audiologic testing in the last year.  They do not  have excessive sun exposure. Discussed the need for sun protection: hats, long sleeves and use of sunscreen if there is significant sun exposure.   Diet: the importance of a healthy diet is discussed. They do have a healthy diet.  The benefits of regular aerobic exercise were discussed. She walks 4 times per week ,  20 minutes water aerobics 3/weekand does yoga at [facility] 2/week  Depression screen: there are no  signs or vegative symptoms of depression- irritability, change in appetite, anhedonia, sadness/tearfullness.  Cognitive assessment: the patient manages all their financial and personal affairs and is actively engaged. They could relate day,date,year and events; recalled 2/3 objects at 3 minutes; performed clock-face test normally.  The following portions of the patient's history were reviewed and updated as appropriate: allergies, current medications, past family history, past medical history,  past surgical history, past social history  and problem list.  Visual acuity was not assessed per patient preference since she has regular follow up with her ophthalmologist.  Has macular degeneration and corneal atrophy managed by [facility] . Hearing and body mass index were assessed and reviewed.   During the course of the visit the patient was educated and counseled about appropriate screening and preventive services including : fall prevention , diabetes screening, nutrition counseling, colorectal cancer screening, and recommended immunizations.    Objective: BP 112/62  Pulse 70  Temp(Src) 97.9 F (36.6 C) (Oral)  Resp 16  Ht 5\' 1"  (1.549 m)  Wt 138 lb 8 oz (62.823 kg)  BMI 26.18 kg/m2  SpO2 98%  General appearance: alert, cooperative and appears stated age Head: Normocephalic, without obvious abnormality, atraumatic Eyes: conjunctivae/corneas clear. PERRL, EOM's intact. Fundi benign. Ears: normal TM's and external ear canals both ears Nose: Nares normal. Septum midline. Mucosa normal. No drainage or sinus tenderness. Throat: lips, mucosa, and tongue normal; teeth  and gums normal Neck: no adenopathy, no carotid bruit, no JVD, supple, symmetrical, trachea midline and thyroid not enlarged, symmetric, no tenderness/mass/nodules Lungs: clear to auscultation bilaterally Breasts: normal appearance, no masses or tenderness Heart: regular rate and rhythm, S1, S2 normal, no murmur, click, rub or  gallop Abdomen: soft, non-tender; bowel sounds normal; no masses,  no organomegaly Extremities: extremities normal, atraumatic, no cyanosis or edema Pulses: 2+ and symmetric Skin: Skin color, texture, turgor normal. No rashes or lesions Neurologic: Alert and oriented X 3, normal strength and tone. Normal symmetric reflexes. Normal coordination and gait.   Assessment  and Plan:   Routine general medical examination at a health care facility Annual comprehensive exam was done including breast exam .  Pelvic exam was done in 2013 and was deferred per patient request today. All screenings have been addressed .   Hypertension goal BP (blood pressure) < 140/80 Well controlled historically without medications,  Given her age.   Baseline EKG normal except for mild LAE suggested.     Vitamin D deficiency Treated with megadose for several weeks,  Repeat  Vit D level was normal today.   Macular degeneration, left eye She remains able to drive . Follow up with [facility] is anticipated but she is apprehensive about Dr Appenzeller's replacement  Other and unspecified hyperlipidemia She was prescribed pravastatin Dec  2013 for LDL > 160 but declined therapy due to her age.   Hoarseness or changing voice She has a history of vocal cord polyps which were removed years ago, but has noticed a change in her voice and occasional coughing with think liquids.  Referral to ENT for evaluation of vocal cords   Updated Medication List Outpatient Encounter Prescriptions as of 12/21/2013  Medication Sig  . ergocalciferol (DRISDOL) 50000 UNITS capsule Take 1 capsule (50,000 Units total) by mouth once a week.  . Multiple Vitamins-Minerals (PRESERVISION AREDS) CAPS Take 1 capsule by mouth 2 (two) times daily.  . Omega-3 Fatty Acids (FISH OIL) 1000 MG CAPS Take 1 capsule by mouth 2 (two) times daily.  Marland Kitchen Raspberry Ketones 100 MG CAPS Take 1 capsule by mouth daily.  . [DISCONTINUED] naproxen sodium (ANAPROX) 220  MG tablet Take 220 mg by mouth 2 (two) times daily with a meal.  . aspirin 81 MG EC tablet Take 1 tablet (81 mg total) by mouth daily. Swallow whole.  . Tdap (BOOSTRIX) 5-2.5-18.5 LF-MCG/0.5 injection Inject 0.5 mLs into the muscle once.  . [DISCONTINUED] darifenacin (ENABLEX) 7.5 MG 24 hr tablet Take 1 tablet (7.5 mg total) by mouth daily.

## 2013-12-21 NOTE — Telephone Encounter (Signed)
I called [facility] [phone]) and [facility] [phone]) - Neither place has 3D mammogram machines.  They stated they would have these late this summer.

## 2013-12-21 NOTE — Progress Notes (Signed)
Pre-visit discussion using our clinic review tool. No additional management support is needed unless otherwise documented below in the visit note.  

## 2013-12-21 NOTE — Telephone Encounter (Signed)
Ok,  Can you have her get it in August at [facility]?

## 2013-12-21 NOTE — Patient Instructions (Signed)
You had your annual Medicare wellness exam today  We will schedule your mammogram soon.  Please use the stool kit to send [place] back a sample to test for blood.  This is your colon CA screening test.   You need to have a TDaP vaccine   I have given you prescriptions for this because they will be cheaper at the health Dept or at your  local pharmacy because Medicare will not reimburse for them.   You received the pneumonia vaccine today.  We will contact you with the bloodwork results

## 2013-12-22 ENCOUNTER — Telehealth: Payer: Self-pay | Admitting: Internal Medicine

## 2013-12-22 LAB — VITAMIN D 25 HYDROXY (VIT D DEFICIENCY, FRACTURES): VIT D 25 HYDROXY: 40 ng/mL (ref 30–89)

## 2013-12-22 NOTE — Telephone Encounter (Signed)
Patient called and left voicemail concerning medications prescribed during OV on 63/15 tried to return call and had to leave message.

## 2013-12-22 NOTE — Telephone Encounter (Signed)
Pt wanted Naproxen and Enablex removed from her medication list. Also states she did not get the stool kit (iFob) when she was in the office. Advised she may stop back by the office at her convenience to pick it up.

## 2013-12-23 ENCOUNTER — Encounter: Payer: Self-pay | Admitting: *Deleted

## 2013-12-24 DIAGNOSIS — R499 Unspecified voice and resonance disorder: Secondary | ICD-10-CM | POA: Insufficient documentation

## 2013-12-24 NOTE — Assessment & Plan Note (Addendum)
Treated with megadose for several weeks,  Repeat  Vit D level was normal today.

## 2013-12-24 NOTE — Assessment & Plan Note (Signed)
She has a history of vocal cord polyps which were removed years ago, but has noticed a change in her voice and occasional coughing with think liquids.  Referral to ENT for evaluation of vocal cords

## 2013-12-24 NOTE — Assessment & Plan Note (Signed)
Annual comprehensive exam was done including breast exam .  Pelvic exam was done in 2013 and was deferred per patient request today. All screenings have been addressed .

## 2013-12-24 NOTE — Assessment & Plan Note (Signed)
She remains able to drive . Follow up with [facility] is anticipated but she is apprehensive about Dr Appenzeller's replacement

## 2013-12-24 NOTE — Assessment & Plan Note (Signed)
She was prescribed pravastatin Dec  2013 for LDL > 160 but declined therapy due to her age.

## 2013-12-24 NOTE — Assessment & Plan Note (Signed)
Well controlled historically without medications,  Given her age.   Baseline EKG normal except for mild LAE suggested.

## 2013-12-29 DIAGNOSIS — D485 Neoplasm of uncertain behavior of skin: Secondary | ICD-10-CM | POA: Diagnosis not present

## 2013-12-29 DIAGNOSIS — Z85828 Personal history of other malignant neoplasm of skin: Secondary | ICD-10-CM | POA: Diagnosis not present

## 2013-12-29 DIAGNOSIS — L821 Other seborrheic keratosis: Secondary | ICD-10-CM | POA: Diagnosis not present

## 2014-02-16 DIAGNOSIS — D485 Neoplasm of uncertain behavior of skin: Secondary | ICD-10-CM | POA: Diagnosis not present

## 2014-02-24 ENCOUNTER — Ambulatory Visit (INDEPENDENT_AMBULATORY_CARE_PROVIDER_SITE_OTHER): Payer: Medicare Other | Admitting: Adult Health

## 2014-02-24 ENCOUNTER — Encounter: Payer: Self-pay | Admitting: Adult Health

## 2014-02-24 VITALS — BP 137/76 | HR 79 | Temp 98.0°F | Resp 14 | Ht 61.0 in | Wt 140.5 lb

## 2014-02-24 DIAGNOSIS — M5440 Lumbago with sciatica, unspecified side: Secondary | ICD-10-CM

## 2014-02-24 DIAGNOSIS — M5430 Sciatica, unspecified side: Secondary | ICD-10-CM | POA: Diagnosis not present

## 2014-02-24 DIAGNOSIS — M545 Low back pain, unspecified: Secondary | ICD-10-CM | POA: Insufficient documentation

## 2014-02-24 MED ORDER — PREDNISONE 10 MG PO TABS: ORAL_TABLET | ORAL | Status: DC

## 2014-02-24 MED ORDER — CYCLOBENZAPRINE HCL 5 MG PO TABS: 5.0000 mg | ORAL_TABLET | Freq: Every evening | ORAL | Status: DC | PRN

## 2014-02-24 NOTE — Progress Notes (Signed)
Patient ID: Jo Burke, female   DOB: 1928-06-24, 80 y.o.   MRN: [id number]    Subjective:    Patient ID: Jo Burke, female    DOB: 06-May-1928, 80 y.o.   MRN: [id number]  HPI  Pt is a pleasant 80 y/o female who lives at [facility]. She participates in daily exercise classes and sometimes does 2 classes a day. She reports hx of low back pain with sciatica. First flared approximately 2 months ago and this improved. She then noticed the pain return ~ 3 weeks ago. Recently did 2 classes and noticed that pain down her legs has worsened. Worse with sitting. Reports shooting pain. Has been taking aleve but did not feel this helped. No pain or numbness down bil LE   Past Medical History  Diagnosis Date  . Macular degeneration, left eye 1992    appenzeller,     Current Outpatient Prescriptions on File Prior to Visit  Medication Sig Dispense Refill  . ergocalciferol (DRISDOL) 50000 UNITS capsule Take 1 capsule (50,000 Units total) by mouth once a week.  4 capsule  3  . Multiple Vitamins-Minerals (PRESERVISION AREDS) CAPS Take 1 capsule by mouth 2 (two) times daily.      . Omega-3 Fatty Acids (FISH OIL) 1000 MG CAPS Take 1 capsule by mouth 2 (two) times daily.      Marland Kitchen Raspberry Ketones 100 MG CAPS Take 1 capsule by mouth daily.      . Tdap (BOOSTRIX) 5-2.5-18.5 LF-MCG/0.5 injection Inject 0.5 mLs into the muscle once.  0.5 mL  0  . aspirin 81 MG EC tablet Take 1 tablet (81 mg total) by mouth daily. Swallow whole.  30 tablet  12   No current facility-administered medications on file prior to visit.     Review of Systems  Constitutional: Negative.   HENT: Negative.   Eyes: Negative.   Respiratory: Negative.   Cardiovascular: Negative.   Gastrointestinal: Negative.   Endocrine: Negative.   Genitourinary: Negative.   Musculoskeletal: Positive for back pain (with sciatica).  Skin: Negative.   Allergic/Immunologic: Negative.   Neurological: Negative.  Negative for weakness and  numbness.  Hematological: Negative.   Psychiatric/Behavioral: Negative.        Objective:  BP 137/76  Pulse 79  Temp(Src) 98 F (36.7 C) (Oral)  Resp 14  Wt 140 lb 8 oz (63.73 kg)  SpO2 98%   Physical Exam  Constitutional: She is oriented to person, place, and time. No distress.  Cardiovascular: Normal rate and regular rhythm.   Pulmonary/Chest: Effort normal. No respiratory distress.  Musculoskeletal: Normal range of motion. She exhibits tenderness (low back area mainly with movement). She exhibits no edema.  Neurological: She is alert and oriented to person, place, and time.  Skin: Skin is warm and dry.  Psychiatric: She has a normal mood and affect. Her behavior is normal. Judgment and thought content normal.      Assessment & Plan:   1. Midline low back pain with sciatica, sciatica laterality unspecified Prednisone taper. Tylenol for pain. Flexeril at bedtime for muscle spasms. Stretching exercises we discussed to release piriformis muscle. If no improvement within 1 week will refer to physical therapy at [facility].

## 2014-02-24 NOTE — Patient Instructions (Addendum)
  Start prednisone taper as follows:  Day #1 - take 6 tablets Day #2 - take 5 tablets Day #3 - take 4 tablets Day #4 - take 3 tablets Day #5 - take 2 tablets Day #6 - take 1 tablet  Flexeril 5 mg 1 tablet as needed at bedtime for muscle spasms. This medication will make you sleepy. Do not drive if taking this medication. Use extra caution not to fall.  You may take tylenol for pain every 6 hours. Do not exceed 3000 mg in a 24 hour period.   Stretching exercises as we discussed.  Apply ice alternating with heat for 20 min - do this 3-4 times a day.  If not improved in 1 week will refer to physical therapy at [facility].

## 2014-02-24 NOTE — Progress Notes (Signed)
Pre visit review using our clinic review tool, if applicable. No additional management support is needed unless otherwise documented below in the visit note. 

## 2014-03-07 ENCOUNTER — Other Ambulatory Visit: Payer: Self-pay | Admitting: Internal Medicine

## 2014-03-07 ENCOUNTER — Telehealth: Payer: Self-pay | Admitting: Internal Medicine

## 2014-03-07 DIAGNOSIS — M545 Low back pain: Secondary | ICD-10-CM

## 2014-03-07 MED ORDER — TRAMADOL HCL 50 MG PO TABS: 50.0000 mg | ORAL_TABLET | Freq: Three times a day (TID) | ORAL | Status: DC | PRN

## 2014-03-07 NOTE — Telephone Encounter (Signed)
Pt refused the Tramadol Rx she states she does not like taking medication.  She does want the PT appoint.

## 2014-03-07 NOTE — Telephone Encounter (Signed)
Tried to return call top patient no answer.

## 2014-03-07 NOTE — Telephone Encounter (Signed)
Pt came in and stated was prescribed cyclobenzaprine and prednisone and stated is not working and pain is going into different places now and would like a call back about what to do.

## 2014-03-07 NOTE — Telephone Encounter (Signed)
Patient was seen by RR,  i can only prescribe PT and tramadol.  Both ordered,  Make appt

## 2014-03-07 NOTE — Telephone Encounter (Signed)
Bilateral pain in legs to start then the pain went to back then to shoulders now has gone back into her back. Patient has taken the prednisone taper but cannot take the flexeril stated that makes her pain worse.  Advised patient that AVS stated to apply heat alternating with ice and that she needed to try the stretching exercise. Patient stated she needs something to help the pain. Please advise.

## 2014-03-14 DIAGNOSIS — M545 Low back pain, unspecified: Secondary | ICD-10-CM | POA: Diagnosis not present

## 2014-03-14 DIAGNOSIS — M6281 Muscle weakness (generalized): Secondary | ICD-10-CM | POA: Diagnosis not present

## 2014-03-16 DIAGNOSIS — M545 Low back pain, unspecified: Secondary | ICD-10-CM | POA: Diagnosis not present

## 2014-03-16 DIAGNOSIS — M6281 Muscle weakness (generalized): Secondary | ICD-10-CM | POA: Diagnosis not present

## 2014-03-20 DIAGNOSIS — M6281 Muscle weakness (generalized): Secondary | ICD-10-CM | POA: Diagnosis not present

## 2014-03-20 DIAGNOSIS — M545 Low back pain, unspecified: Secondary | ICD-10-CM | POA: Diagnosis not present

## 2014-03-21 DIAGNOSIS — M545 Low back pain, unspecified: Secondary | ICD-10-CM | POA: Diagnosis not present

## 2014-03-21 DIAGNOSIS — M6281 Muscle weakness (generalized): Secondary | ICD-10-CM | POA: Diagnosis not present

## 2014-03-23 DIAGNOSIS — M6281 Muscle weakness (generalized): Secondary | ICD-10-CM | POA: Diagnosis not present

## 2014-03-23 DIAGNOSIS — M545 Low back pain, unspecified: Secondary | ICD-10-CM | POA: Diagnosis not present

## 2014-03-28 DIAGNOSIS — M545 Low back pain, unspecified: Secondary | ICD-10-CM | POA: Diagnosis not present

## 2014-03-28 DIAGNOSIS — M6281 Muscle weakness (generalized): Secondary | ICD-10-CM | POA: Diagnosis not present

## 2014-03-30 DIAGNOSIS — M6281 Muscle weakness (generalized): Secondary | ICD-10-CM | POA: Diagnosis not present

## 2014-03-30 DIAGNOSIS — M545 Low back pain, unspecified: Secondary | ICD-10-CM | POA: Diagnosis not present

## 2014-04-12 DIAGNOSIS — M6281 Muscle weakness (generalized): Secondary | ICD-10-CM | POA: Diagnosis not present

## 2014-04-12 DIAGNOSIS — M545 Low back pain, unspecified: Secondary | ICD-10-CM | POA: Diagnosis not present

## 2014-04-13 DIAGNOSIS — M6281 Muscle weakness (generalized): Secondary | ICD-10-CM | POA: Diagnosis not present

## 2014-04-13 DIAGNOSIS — M545 Low back pain, unspecified: Secondary | ICD-10-CM | POA: Diagnosis not present

## 2014-04-24 DIAGNOSIS — H3532 Exudative age-related macular degeneration: Secondary | ICD-10-CM | POA: Diagnosis not present

## 2014-06-14 DIAGNOSIS — H40003 Preglaucoma, unspecified, bilateral: Secondary | ICD-10-CM | POA: Diagnosis not present

## 2014-10-09 DIAGNOSIS — M7550 Bursitis of unspecified shoulder: Secondary | ICD-10-CM | POA: Diagnosis not present

## 2014-10-10 DIAGNOSIS — S43492A Other sprain of left shoulder joint, initial encounter: Secondary | ICD-10-CM | POA: Diagnosis not present

## 2014-10-11 DIAGNOSIS — S43492A Other sprain of left shoulder joint, initial encounter: Secondary | ICD-10-CM | POA: Diagnosis not present

## 2014-10-13 DIAGNOSIS — S43492A Other sprain of left shoulder joint, initial encounter: Secondary | ICD-10-CM | POA: Diagnosis not present

## 2014-10-16 DIAGNOSIS — S43492A Other sprain of left shoulder joint, initial encounter: Secondary | ICD-10-CM | POA: Diagnosis not present

## 2014-10-17 DIAGNOSIS — S43492A Other sprain of left shoulder joint, initial encounter: Secondary | ICD-10-CM | POA: Diagnosis not present

## 2014-10-19 DIAGNOSIS — S43492A Other sprain of left shoulder joint, initial encounter: Secondary | ICD-10-CM | POA: Diagnosis not present

## 2014-10-23 ENCOUNTER — Ambulatory Visit (INDEPENDENT_AMBULATORY_CARE_PROVIDER_SITE_OTHER): Payer: Medicare Other | Admitting: Internal Medicine

## 2014-10-23 ENCOUNTER — Encounter: Payer: Self-pay | Admitting: Internal Medicine

## 2014-10-23 VITALS — BP 120/60 | HR 64 | Temp 97.5°F | Resp 16 | Ht 61.0 in | Wt 135.0 lb

## 2014-10-23 DIAGNOSIS — R5383 Other fatigue: Secondary | ICD-10-CM

## 2014-10-23 DIAGNOSIS — Z0000 Encounter for general adult medical examination without abnormal findings: Secondary | ICD-10-CM

## 2014-10-23 DIAGNOSIS — R51 Headache: Secondary | ICD-10-CM

## 2014-10-23 DIAGNOSIS — Z789 Other specified health status: Secondary | ICD-10-CM | POA: Diagnosis not present

## 2014-10-23 DIAGNOSIS — E785 Hyperlipidemia, unspecified: Secondary | ICD-10-CM | POA: Diagnosis not present

## 2014-10-23 DIAGNOSIS — H3530 Unspecified macular degeneration: Secondary | ICD-10-CM | POA: Diagnosis not present

## 2014-10-23 DIAGNOSIS — G44229 Chronic tension-type headache, not intractable: Secondary | ICD-10-CM

## 2014-10-23 DIAGNOSIS — Z66 Do not resuscitate: Secondary | ICD-10-CM

## 2014-10-23 DIAGNOSIS — Z1159 Encounter for screening for other viral diseases: Secondary | ICD-10-CM

## 2014-10-23 DIAGNOSIS — M7551 Bursitis of right shoulder: Secondary | ICD-10-CM

## 2014-10-23 DIAGNOSIS — E559 Vitamin D deficiency, unspecified: Secondary | ICD-10-CM | POA: Diagnosis not present

## 2014-10-23 DIAGNOSIS — G250 Essential tremor: Secondary | ICD-10-CM

## 2014-10-23 DIAGNOSIS — Z1239 Encounter for other screening for malignant neoplasm of breast: Secondary | ICD-10-CM

## 2014-10-23 DIAGNOSIS — R531 Weakness: Secondary | ICD-10-CM

## 2014-10-23 DIAGNOSIS — Z1231 Encounter for screening mammogram for malignant neoplasm of breast: Secondary | ICD-10-CM

## 2014-10-23 DIAGNOSIS — R519 Headache, unspecified: Secondary | ICD-10-CM

## 2014-10-23 LAB — CBC WITH DIFFERENTIAL/PLATELET
BASOS PCT: 0.8 % (ref 0.0–3.0)
Basophils Absolute: 0 10*3/uL (ref 0.0–0.1)
Eosinophils Absolute: 0.2 10*3/uL (ref 0.0–0.7)
Eosinophils Relative: 4.2 % (ref 0.0–5.0)
HEMATOCRIT: 39.1 % (ref 36.0–46.0)
HEMOGLOBIN: 13.3 g/dL (ref 12.0–15.0)
LYMPHS PCT: 19.4 % (ref 12.0–46.0)
Lymphs Abs: 1.1 10*3/uL (ref 0.7–4.0)
MCHC: 33.9 g/dL (ref 30.0–36.0)
MCV: 87.6 fl (ref 78.0–100.0)
Monocytes Absolute: 0.7 10*3/uL (ref 0.1–1.0)
Monocytes Relative: 12.1 % — ABNORMAL HIGH (ref 3.0–12.0)
NEUTROS PCT: 63.5 % (ref 43.0–77.0)
Neutro Abs: 3.6 10*3/uL (ref 1.4–7.7)
Platelets: 209 10*3/uL (ref 150.0–400.0)
RBC: 4.46 Mil/uL (ref 3.87–5.11)
RDW: 14.8 % (ref 11.5–15.5)
WBC: 5.7 10*3/uL (ref 4.0–10.5)

## 2014-10-23 LAB — COMPREHENSIVE METABOLIC PANEL
ALK PHOS: 74 U/L (ref 39–117)
ALT: 28 U/L (ref 0–35)
AST: 29 U/L (ref 0–37)
Albumin: 3.7 g/dL (ref 3.5–5.2)
BILIRUBIN TOTAL: 0.8 mg/dL (ref 0.2–1.2)
BUN: 22 mg/dL (ref 6–23)
CALCIUM: 9.4 mg/dL (ref 8.4–10.5)
CO2: 26 mEq/L (ref 19–32)
Chloride: 106 mEq/L (ref 96–112)
Creatinine, Ser: 0.9 mg/dL (ref 0.40–1.20)
GFR: 63.29 mL/min (ref >60.00)
Glucose, Bld: 85 mg/dL (ref 70–99)
Potassium: 4.3 mEq/L (ref 3.5–5.1)
Sodium: 137 mEq/L (ref 135–145)
Total Protein: 6.6 g/dL (ref 6.0–8.3)

## 2014-10-23 LAB — LIPID PANEL
Cholesterol: 261 mg/dL — ABNORMAL HIGH (ref 0–200)
HDL: 59.4 mg/dL (ref >39.00)
LDL Cholesterol: 187 mg/dL — ABNORMAL HIGH (ref 0–99)
NonHDL: 201.6
TRIGLYCERIDES: 75 mg/dL (ref 0.0–149.0)
Total CHOL/HDL Ratio: 4
VLDL: 15 mg/dL (ref 0.0–40.0)

## 2014-10-23 LAB — VITAMIN D 25 HYDROXY (VIT D DEFICIENCY, FRACTURES): VITD: 26.77 ng/mL — ABNORMAL LOW (ref 30.00–100.00)

## 2014-10-23 MED ORDER — AMITRIPTYLINE HCL 25 MG PO TABS: 25.0000 mg | ORAL_TABLET | Freq: Every day | ORAL | Status: DC

## 2014-10-23 NOTE — Assessment & Plan Note (Signed)
Both eyes,  Managed at [facility] by the new surgeon.,  Wet/dry.  May now have low pressure glaucoma, has another eval planned. Can still read large print and uses a Kindle

## 2014-10-23 NOTE — Progress Notes (Signed)
Patient ID: Jo Burke, female   DOB: Sep 04, 1927, 81 y.o.   MRN: [id number]  The patient is here for annual Medicare wellness examination and management of other chronic and acute problems.  1)"My temor getting worse."  . Has a fine tremor in hands,  Diagnosed as essential tremor by neurology,  Now having "whole body tremors" .   2)  Feels episodes of  generalized weakness which come and go , not daily.   Occurring for the last 3 months.  No nausea, chest pain or shortness of breath. Not related to fasting state,  occurs in the after lunch around 3 pm.  Goes to exercise class  but occurs even on the days she does not exercise .Does not skip meals,  grazes all day .  resolves if she rests /sits down,  Does not nap due to insomnia. Sleeps with husband who has mild dementia and nocturnal restlessness.  3) Daily headache resolved with ibuprofen taken daily or sometimes twice daily,  Averages 5 days Of ibuprofen use  Per week.  but omits it twice weekly    4) Episode of severe left shoulder pain,  Woke up  With it  after participating in  a water aerobics exercise class.  Treated as  A rotator cuff tear  Was Treated with injection, baclofen and vicodin by Urgent Care.  Going to PT at [facility] and pain is improving     The risk factors are reflected in the social history.  The roster of all physicians providing medical care to patient - is listed in the Snapshot section of the chart.  Activities of daily living:  The patient is 100% independent in all ADLs: dressing, toileting, feeding as well as independent mobility  Home safety : The patient has smoke detectors in the home. They wear seatbelts.  There are no firearms at home. There is no violence in the home.   There is no risks for hepatitis, STDs or HIV. There is no   history of blood transfusion. They have no travel history to infectious disease endemic areas of the world.  The patient has seen their dentist in the last six month. They  have seen their eye doctor in the last year. They admit to slight hearing difficulty with regard to whispered voices and some television programs.  They have deferred audiologic testing in the last year.  They do not  have excessive sun exposure. Discussed the need for sun protection: hats, long sleeves and use of sunscreen if there is significant sun exposure.   Diet: the importance of a healthy diet is discussed. They do have a healthy diet.  The benefits of regular aerobic exercise were discussed. She walks 4 times per week ,  20 minutes.   Depression screen: there are no signs or vegative symptoms of depression- irritability, change in appetite, anhedonia, sadness/tearfullness.  Cognitive assessment: the patient manages all their financial and personal affairs and is actively engaged. They could relate day,date,year and events; recalled 2/3 objects at 3 minutes; performed clock-face test normally.  The following portions of the patient's history were reviewed and updated as appropriate: allergies, current medications, past family history, past medical history,  past surgical history, past social history  and problem list.  Visual acuity was not assessed per patient preference since she has regular follow up with her ophthalmologist. Hearing and body mass index were assessed and reviewed.   During the course of the visit the patient was educated and counseled about appropriate  screening and preventive services including : fall prevention , diabetes screening, nutrition counseling, colorectal cancer screening, and recommended immunizations.    Review of Systems:  Patient denies headache, fevers, malaise, unintentional weight loss, skin rash, eye pain, sinus congestion and sinus pain, sore throat, dysphagia,  hemoptysis , cough, dyspnea, wheezing, chest pain, palpitations, orthopnea, edema, abdominal pain, nausea, melena, diarrhea, constipation, flank pain, dysuria, hematuria, urinary  Frequency,  nocturia, numbness, tingling, seizures,  Focal weakness, Loss of consciousness,  Tremor, insomnia, depression, anxiety, and suicidal ideation.    Objective:  BP 120/60 mmHg  Pulse 64  Temp(Src) 97.5 F (36.4 C) (Oral)  Resp 16  Ht 5\' 1"  (1.549 m)  Wt 135 lb (61.236 kg)  BMI 25.52 kg/m2  SpO2 97%  General appearance: alert, cooperative and appears stated age Head: Normocephalic, without obvious abnormality, atraumatic Eyes: conjunctivae/corneas clear. PERRL, EOM's intact. Fundi benign. Ears: normal TM's and external ear canals both ears Nose: Nares normal. Septum midline. Mucosa normal. No drainage or sinus tenderness. Throat: lips, mucosa, and tongue normal; teeth and gums normal Neck: no adenopathy, no carotid bruit, no JVD, supple, symmetrical, trachea midline and thyroid not enlarged, symmetric, no tenderness/mass/nodules Lungs: clear to auscultation bilaterally Breasts: normal appearance, no masses or tenderness Heart: regular rate and rhythm, S1, S2 normal, no murmur, click, rub or gallop Abdomen: soft, non-tender; bowel sounds normal; no masses,  no organomegaly Extremities: extremities normal, atraumatic, no cyanosis or edema Pulses: 2+ and symmetric Skin: Skin color, texture, turgor normal. No rashes or lesions Neurologic: Alert and oriented X 3, normal strength and tone. Normal symmetric reflexes. Normal coordination and gait.   Assessment and Plan:  Problem List Items Addressed This Visit      Unprioritized   Macular degeneration, left eye - Primary    Both eyes,  Managed at [facility] by the new surgeon.,  Wet/dry.  May now have low pressure glaucoma, has another eval planned. Can still read large print and uses a Kindle       Routine general medical examination at a health care facility    Annual Medicare wellness  exam was done as well as a comprehensive physical exam and management of acute and chronic conditions .  During the course of the visit the patient  was educated and counseled about appropriate screening and preventive services including : fall prevention , diabetes screening, nutrition counseling, colorectal cancer screening, and recommended immunizations.  Printed recommendations for health maintenance screenings was given.       Vitamin D deficiency   Relevant Orders   Vit D  25 hydroxy (rtn osteoporosis monitoring) (Completed)   DNR (do not resuscitate)   Patient has healthcare proxy and living will   Benign essential tremor syndrome    reassurance provided that she does not have signs of parkinson's syndrome       Chronic daily headache    Possibly secondary to medication rebound.  Neuro exam is normal for age. trial of elavil.       Relevant Medications   amitriptyline (ELAVIL) tablet   Episode of generalized weakness    Recurrent episess occurring without falls, chest pain and dyspnea .  Etiology unclear.  Advised to go to [facility] for evaluation of vital signs and CBG the next time the symptoms occur.       Bursitis of right shoulder    Recent episode brought on by unusual activity during water aerobics.  Now resolved.       Other Visit  Diagnoses    Other fatigue        Relevant Orders    CBC with Differential/Platelet (Completed)    Comprehensive metabolic panel (Completed)    TSH (Completed)    Chronic tension-type headache, not intractable        Relevant Medications    amitriptyline (ELAVIL) tablet    Tremor, essential        Hyperlipidemia LDL goal <130        Relevant Orders    Lipid panel (Completed)    Need for hepatitis C screening test        Relevant Orders    Hepatitis C antibody (Completed)    Breast cancer screening, high risk patient        Relevant Orders    MM DIGITAL SCREENING BILATERAL

## 2014-10-23 NOTE — Patient Instructions (Addendum)
We discussed a trial of Elavil in the evening to reduce your headache frequency,  It may make you sleepy so take around 8 pm.    Please  Bring [place] a copy of your advance directives/living will  Consider using the NuStep machine and return to the pool to do your walking  We will order your mammogram today  Health Maintenance Adopting a healthy lifestyle and getting preventive care can go a long way to promote health and wellness. Talk with your health care provider about what schedule of regular examinations is right for you. This is a good chance for you to check in with your provider about disease prevention and staying healthy. In between checkups, there are plenty of things you can do on your own. Experts have done a lot of research about which lifestyle changes and preventive measures are most likely to keep you healthy. Ask your health care provider for more information. WEIGHT AND DIET  Eat a healthy diet  Be sure to include plenty of vegetables, fruits, low-fat dairy products, and lean protein.  Do not eat a lot of foods high in solid fats, added sugars, or salt.  Get regular exercise. This is one of the most important things you can do for your health.  Most adults should exercise for at least 150 minutes each week. The exercise should increase your heart rate and make you sweat (moderate-intensity exercise).  Most adults should also do strengthening exercises at least twice a week. This is in addition to the moderate-intensity exercise.  Maintain a healthy weight  Body mass index (BMI) is a measurement that can be used to identify possible weight problems. It estimates body fat based on height and weight. Your health care provider can help determine your BMI and help you achieve or maintain a healthy weight.  For females 10 years of age and older:   A BMI below 18.5 is considered underweight.  A BMI of 18.5 to 24.9 is normal.  A BMI of 25 to 29.9 is considered  overweight.  A BMI of 30 and above is considered obese.  Watch levels of cholesterol and blood lipids  You should start having your blood tested for lipids and cholesterol at 79 years of age, then have this test every 5 years.  You may need to have your cholesterol levels checked more often if:  Your lipid or cholesterol levels are high.  You are older than 79 years of age.  You are at high risk for heart disease.  CANCER SCREENING   Lung Cancer  Lung cancer screening is recommended for adults 82-51 years old who are at high risk for lung cancer because of a history of smoking.  A yearly low-dose CT scan of the lungs is recommended for people who:  Currently smoke.  Have quit within the past 15 years.  Have at least a 30-pack-year history of smoking. A pack year is smoking an average of one pack of cigarettes a day for 1 year.  Yearly screening should continue until it has been 15 years since you quit.  Yearly screening should stop if you develop a health problem that would prevent you from having lung cancer treatment.  Breast Cancer  Practice breast self-awareness. This means understanding how your breasts normally appear and feel.  It also means doing regular breast self-exams. Let your health care provider know about any changes, no matter how small.  If you are in your 20s or 30s, you should have a clinical  breast exam (CBE) by a health care provider every 1-3 years as part of a regular health exam.  If you are 59 or older, have a CBE every year. Also consider having a breast X-ray (mammogram) every year.  If you have a family history of breast cancer, talk to your health care provider about genetic screening.  If you are at high risk for breast cancer, talk to your health care provider about having an MRI and a mammogram every year.  Breast cancer gene (BRCA) assessment is recommended for women who have family members with BRCA-related cancers. BRCA-related  cancers include:  Breast.  Ovarian.  Tubal.  Peritoneal cancers.  Results of the assessment will determine the need for genetic counseling and BRCA1 and BRCA2 testing. Cervical Cancer Routine pelvic examinations to screen for cervical cancer are no longer recommended for nonpregnant women who are considered low risk for cancer of the pelvic organs (ovaries, uterus, and vagina) and who do not have symptoms. A pelvic examination may be necessary if you have symptoms including those associated with pelvic infections. Ask your health care provider if a screening pelvic exam is right for you.   The Pap test is the screening test for cervical cancer for women who are considered at risk.  If you had a hysterectomy for a problem that was not cancer or a condition that could lead to cancer, then you no longer need Pap tests.  If you are older than 65 years, and you have had normal Pap tests for the past 10 years, you no longer need to have Pap tests.  If you have had past treatment for cervical cancer or a condition that could lead to cancer, you need Pap tests and screening for cancer for at least 20 years after your treatment.  If you no longer get a Pap test, assess your risk factors if they change (such as having a new sexual partner). This can affect whether you should start being screened again.  Some women have medical problems that increase their chance of getting cervical cancer. If this is the case for you, your health care provider may recommend more frequent screening and Pap tests.  The human papillomavirus (HPV) test is another test that may be used for cervical cancer screening. The HPV test looks for the virus that can cause cell changes in the cervix. The cells collected during the Pap test can be tested for HPV.  The HPV test can be used to screen women 7 years of age and older. Getting tested for HPV can extend the interval between normal Pap tests from three to five  years.  An HPV test also should be used to screen women of any age who have unclear Pap test results.  After 79 years of age, women should have HPV testing as often as Pap tests.  Colorectal Cancer  This type of cancer can be detected and often prevented.  Routine colorectal cancer screening usually begins at 79 years of age and continues through 79 years of age.  Your health care provider may recommend screening at an earlier age if you have risk factors for colon cancer.  Your health care provider may also recommend using home test kits to check for hidden blood in the stool.  A small camera at the end of a tube can be used to examine your colon directly (sigmoidoscopy or colonoscopy). This is done to check for the earliest forms of colorectal cancer.  Routine screening usually begins at age  50.  Direct examination of the colon should be repeated every 5-10 years through 79 years of age. However, you may need to be screened more often if early forms of precancerous polyps or small growths are found. Skin Cancer  Check your skin from head to toe regularly.  Tell your health care provider about any new moles or changes in moles, especially if there is a change in a mole's shape or color.  Also tell your health care provider if you have a mole that is larger than the size of a pencil eraser.  Always use sunscreen. Apply sunscreen liberally and repeatedly throughout the day.  Protect yourself by wearing long sleeves, pants, a wide-brimmed hat, and sunglasses whenever you are outside. HEART DISEASE, DIABETES, AND HIGH BLOOD PRESSURE   Have your blood pressure checked at least every 1-2 years. High blood pressure causes heart disease and increases the risk of stroke.  If you are between 29 years and 50 years old, ask your health care provider if you should take aspirin to prevent strokes.  Have regular diabetes screenings. This involves taking a blood sample to check your fasting  blood sugar level.  If you are at a normal weight and have a low risk for diabetes, have this test once every three years after 79 years of age.  If you are overweight and have a high risk for diabetes, consider being tested at a younger age or more often. PREVENTING INFECTION  Hepatitis B  If you have a higher risk for hepatitis B, you should be screened for this virus. You are considered at high risk for hepatitis B if:  You were born in a country where hepatitis B is common. Ask your health care provider which countries are considered high risk.  Your parents were born in a high-risk country, and you have not been immunized against hepatitis B (hepatitis B vaccine).  You have HIV or AIDS.  You use needles to inject street drugs.  You live with someone who has hepatitis B.  You have had sex with someone who has hepatitis B.  You get hemodialysis treatment.  You take certain medicines for conditions, including cancer, organ transplantation, and autoimmune conditions. Hepatitis C  Blood testing is recommended for:  Everyone born from [birth year] through 1963.  Anyone with known risk factors for hepatitis C. Sexually transmitted infections (STIs)  You should be screened for sexually transmitted infections (STIs) including gonorrhea and chlamydia if:  You are sexually active and are younger than 79 years of age.  You are older than 80 years of age and your health care provider tells you that you are at risk for this type of infection.  Your sexual activity has changed since you were last screened and you are at an increased risk for chlamydia or gonorrhea. Ask your health care provider if you are at risk.  If you do not have HIV, but are at risk, it may be recommended that you take a prescription medicine daily to prevent HIV infection. This is called pre-exposure prophylaxis (PrEP). You are considered at risk if:  You are sexually active and do not regularly use condoms or know  the HIV status of your partner(s).  You take drugs by injection.  You are sexually active with a partner who has HIV. Talk with your health care provider about whether you are at high risk of being infected with HIV. If you choose to begin PrEP, you should first be tested for HIV. You should  then be tested every 3 months for as long as you are taking PrEP.  PREGNANCY   If you are premenopausal and you may become pregnant, ask your health care provider about preconception counseling.  If you may become pregnant, take 400 to 800 micrograms (mcg) of folic acid every day.  If you want to prevent pregnancy, talk to your health care provider about birth control (contraception). OSTEOPOROSIS AND MENOPAUSE   Osteoporosis is a disease in which the bones lose minerals and strength with aging. This can result in serious bone fractures. Your risk for osteoporosis can be identified using a bone density scan.  If you are 33 years of age or older, or if you are at risk for osteoporosis and fractures, ask your health care provider if you should be screened.  Ask your health care provider whether you should take a calcium or vitamin D supplement to lower your risk for osteoporosis.  Menopause may have certain physical symptoms and risks.  Hormone replacement therapy may reduce some of these symptoms and risks. Talk to your health care provider about whether hormone replacement therapy is right for you.  HOME CARE INSTRUCTIONS   Schedule regular health, dental, and eye exams.  Stay current with your immunizations.   Do not use any tobacco products including cigarettes, chewing tobacco, or electronic cigarettes.  If you are pregnant, do not drink alcohol.  If you are breastfeeding, limit how much and how often you drink alcohol.  Limit alcohol intake to no more than 1 drink per day for nonpregnant women. One drink equals 12 ounces of beer, 5 ounces of wine, or 1 ounces of hard liquor.  Do not  use street drugs.  Do not share needles.  Ask your health care provider for help if you need support or information about quitting drugs.  Tell your health care provider if you often feel depressed.  Tell your health care provider if you have ever been abused or do not feel safe at home. Document Released: 01/20/2011 Document Revised: 11/21/2013 Document Reviewed: 06/08/2013 [facility] Patient Information 2015 [place], [place]. This information is not intended to replace advice given to you by your health care provider. Make sure you discuss any questions you have with your health care provider.         ``````++  ++

## 2014-10-23 NOTE — Progress Notes (Signed)
Pre-visit discussion using our clinic review tool. No additional management support is needed unless otherwise documented below in the visit note.  

## 2014-10-24 ENCOUNTER — Encounter: Payer: Self-pay | Admitting: Internal Medicine

## 2014-10-24 DIAGNOSIS — S43492A Other sprain of left shoulder joint, initial encounter: Secondary | ICD-10-CM | POA: Diagnosis not present

## 2014-10-24 DIAGNOSIS — R51 Headache: Secondary | ICD-10-CM

## 2014-10-24 DIAGNOSIS — G250 Essential tremor: Secondary | ICD-10-CM | POA: Insufficient documentation

## 2014-10-24 DIAGNOSIS — R531 Weakness: Secondary | ICD-10-CM | POA: Insufficient documentation

## 2014-10-24 DIAGNOSIS — M7551 Bursitis of right shoulder: Secondary | ICD-10-CM | POA: Insufficient documentation

## 2014-10-24 DIAGNOSIS — R519 Headache, unspecified: Secondary | ICD-10-CM | POA: Insufficient documentation

## 2014-10-24 LAB — TSH: TSH: 0.96 u[IU]/mL (ref 0.35–4.50)

## 2014-10-24 LAB — HEPATITIS C ANTIBODY: HCV Ab: NEGATIVE

## 2014-10-24 NOTE — Assessment & Plan Note (Signed)
reassurance provided that she does not have signs of parkinson's syndrome

## 2014-10-24 NOTE — Assessment & Plan Note (Addendum)
Recurrent episodes are occurring without falls, chest pain and dyspnea .  Etiology is unclear.  No signs of anemia, thyroid or renal dysfunction .  Advised to go to [facility] for evaluation of vital signs and CBG the next time the symptoms occur.   Lab Results  Component Value Date   WBC 5.7 10/23/2014   HGB 13.3 10/23/2014   HCT 39.1 10/23/2014   MCV 87.6 10/23/2014   PLT 209.0 10/23/2014   Lab Results  Component Value Date   TSH 0.96 10/23/2014   Lab Results  Component Value Date   CREATININE 0.90 10/23/2014   Lab Results  Component Value Date   NA 137 10/23/2014   K 4.3 10/23/2014   CL 106 10/23/2014   CO2 26 10/23/2014

## 2014-10-24 NOTE — Assessment & Plan Note (Signed)
She declines therapy due to age.  Lab Results  Component Value Date   CHOL 261* 10/23/2014   HDL 59.40 10/23/2014   LDLCALC 187* 10/23/2014   LDLDIRECT 209.0 06/23/2012   TRIG 75.0 10/23/2014   CHOLHDL 4 10/23/2014

## 2014-10-24 NOTE — Assessment & Plan Note (Signed)
Possibly secondary to medication rebound.  Neuro exam is normal for age. trial of elavil.

## 2014-10-24 NOTE — Assessment & Plan Note (Signed)

## 2014-10-24 NOTE — Assessment & Plan Note (Signed)
Recent episode brought on by unusual activity during water aerobics.  Now resolved.

## 2014-10-24 NOTE — Assessment & Plan Note (Signed)
Very mild, continue 2000  IUs daily

## 2014-10-26 DIAGNOSIS — S43492A Other sprain of left shoulder joint, initial encounter: Secondary | ICD-10-CM | POA: Diagnosis not present

## 2014-10-27 ENCOUNTER — Encounter: Payer: Self-pay | Admitting: *Deleted

## 2014-10-27 DIAGNOSIS — S43492A Other sprain of left shoulder joint, initial encounter: Secondary | ICD-10-CM | POA: Diagnosis not present

## 2014-11-12 NOTE — H&P (Signed)
PATIENT NAME:  Jo Burke, ERCOLE MR#:  [id number] DATE OF BIRTH:  07-Aug-1927  DATE OF ADMISSION:  07/09/2011  PRIMARY CARE PHYSICIAN: None.   CHIEF COMPLAINT: Cough.   HISTORY OF PRESENT ILLNESS: This is an 81 year old female who one week ago felt like she had a cold. It went into her chest so after nine days of not feeling well she asked the nurse at [facility] to come check her out and she was sent over to the [facility] and they referred her into the Emergency Room when she mentioned that she had some chest heaviness. She states that the chest heaviness is constant, 3 to 4 out of 10 in intensity. No relation to activity. She feels it is all related to her cold and coughing. She has had a decreased appetite recently and is taking Robitussin for her cold. In the ER, she had a chest x-ray that was negative for pneumonia. EKG showed sinus bradycardia. Her blood pressure was found to be very elevated at 200/60 and hospitalist services were contacted for further evaluation.   PAST MEDICAL HISTORY: Macular degeneration.   PAST SURGICAL HISTORY:  1. Appendectomy.  2. Vocal cord polyp. 3. Fracture of the wrist.   ALLERGIES: Sulfa.   SOCIAL HISTORY: She lives at [facility] independent living with her husband. Drinks wine one glass four times per week with dinner. No smoking. No drug use.   MEDICATIONS:  1. PreserVision daily.  2. Fish Oil daily.   FAMILY HISTORY: Father died of lung cancer. Mother died of old age at age 5 and had breast cancer. Two brothers died, one of liver cancer and one of lung cancer.   REVIEW OF SYSTEMS: CONSTITUTIONAL: Positive for sweats. No fever or chills. Positive for headache. No weight loss. No weight gain. No weakness or fatigue. EYES: She does wear reading glasses. EARS, NOSE, MOUTH, AND THROAT: Positive for runny nose. No sore throat. No difficulty swallowing. CARDIOVASCULAR: Positive for chest heaviness. No palpitations. RESPIRATORY: Positive for shortness  of breath. Positive for cough. No sputum. No hemoptysis. GASTROINTESTINAL: Positive for diarrhea. No nausea. No vomiting. No abdominal pain. No bright red blood per rectum. No melena. GENITOURINARY: No burning on urination or hematuria. MUSCULOSKELETAL: No joint pain or muscle pain. INTEGUMENTARY: No rashes or eruptions. NEUROLOGIC: No fainting or blackouts. PSYCHIATRIC: No anxiety or depression. ENDOCRINE: No thyroid problems. HEMATOLOGIC/LYMPHATIC: No anemia. No easy bruising or bleeding.   PHYSICAL EXAMINATION:   VITAL SIGNS: Blood pressure 205/64, temperature 98.2, pulse 64, respirations 16, pulse oximetry 100% on room air.   GENERAL: No respiratory distress.   EYES: Conjunctivae and lids normal. Pupils equal, round, and reactive to light. Extraocular muscles intact. No nystagmus.   EARS, NOSE, MOUTH, AND THROAT: Tympanic membranes no erythema. Nasal mucosa no erythema. Throat no erythema. No exudate seen. Lips and gums normal.   NECK: No JVD. No bruits. No lymphadenopathy. No thyromegaly. No thyroid nodules palpated.   RESPIRATORY: Decreased breath sounds bilaterally. Coarse throughout entire lung field. Slight expiratory wheeze.   CARDIOVASCULAR: S1, S2 normal. No gallops, rubs, or murmurs heard. Carotid upstroke 2+ bilaterally. No bruits. Dorsalis pedis pulses 2+ bilaterally. No edema to lower extremity.   ABDOMEN: Soft, nontender. No organomegaly/splenomegaly. Normoactive bowel sounds. No masses felt.   LYMPHATIC: No lymph nodes in the neck.   MUSCULOSKELETAL: No clubbing, edema, or cyanosis.   SKIN: No rashes or ulcers seen.   NEUROLOGIC: Cranial nerves II through XII grossly intact. Deep tendon reflexes 2+ bilateral lower extremities.  PSYCHIATRIC: The patient is oriented to person, place, and time.   LABORATORY AND RADIOLOGICAL DATA: Urinalysis 1+ leukocyte esterase, trace ketones. Chest x-ray no acute cardiopulmonary disease. White blood cell count 4.6, hemoglobin and  hematocrit 12.6 and 37.9, platelet count 225. Troponin negative. Glucose 86, BUN 18, creatinine 0.79, sodium 141, potassium 4.1, chloride 106, CO2 25. Liver function tests normal. BNP 446. EKG sinus bradycardia, 58 beats per minute.   ASSESSMENT AND PLAN:  1. Malignant hypertension. Will start Norvasc 5 mg p.o. stat and 5 mg p.o. daily. Can consider hydrochlorothiazide since the patient has isolated systolic hypertension. Could also be related to the cold. Will check a few more blood pressures here. Admit as an observation.  2. Chest pain. I think this is probably secondary to her cold. Will get serial cardiac enzymes and hopefully discharge tomorrow.  3. Bronchitis. Will give DuoNeb nebulizer solution, Zithromax, and Rocephin for right now.  4. Macular degeneration. Continue PreserVision.   CODE STATUS: The patient wishes to be a DO NOT RESUSCITATE. This is ordered.  TIME SPENT ON ADMISSION: 55 minutes.   ____________________________ Tana Conch. Leslye Peer, MD [id number] D: 07/09/2011 21:42:44 ET T: 07/10/2011 06:57:29 ET JOB#: [id number]  cc: Tana Conch. Leslye Peer, MD, <Dictator> Marisue Brooklyn MD ELECTRONICALLY SIGNED 08/06/2011 15:54

## 2014-11-13 DIAGNOSIS — H40003 Preglaucoma, unspecified, bilateral: Secondary | ICD-10-CM | POA: Diagnosis not present

## 2014-11-16 DIAGNOSIS — H40003 Preglaucoma, unspecified, bilateral: Secondary | ICD-10-CM | POA: Diagnosis not present

## 2015-01-08 DIAGNOSIS — H3562 Retinal hemorrhage, left eye: Secondary | ICD-10-CM | POA: Diagnosis not present

## 2015-01-08 DIAGNOSIS — H3532 Exudative age-related macular degeneration: Secondary | ICD-10-CM | POA: Diagnosis not present

## 2015-02-19 DIAGNOSIS — H3532 Exudative age-related macular degeneration: Secondary | ICD-10-CM | POA: Diagnosis not present

## 2015-02-20 DIAGNOSIS — L821 Other seborrheic keratosis: Secondary | ICD-10-CM | POA: Diagnosis not present

## 2015-02-20 DIAGNOSIS — L578 Other skin changes due to chronic exposure to nonionizing radiation: Secondary | ICD-10-CM | POA: Diagnosis not present

## 2015-02-20 DIAGNOSIS — I8393 Asymptomatic varicose veins of bilateral lower extremities: Secondary | ICD-10-CM | POA: Diagnosis not present

## 2015-02-20 DIAGNOSIS — Z1283 Encounter for screening for malignant neoplasm of skin: Secondary | ICD-10-CM | POA: Diagnosis not present

## 2015-02-20 DIAGNOSIS — L570 Actinic keratosis: Secondary | ICD-10-CM | POA: Diagnosis not present

## 2015-02-20 DIAGNOSIS — Z85828 Personal history of other malignant neoplasm of skin: Secondary | ICD-10-CM | POA: Diagnosis not present

## 2015-02-20 DIAGNOSIS — D485 Neoplasm of uncertain behavior of skin: Secondary | ICD-10-CM | POA: Diagnosis not present

## 2015-02-20 DIAGNOSIS — D1800 Hemangioma unspecified site: Secondary | ICD-10-CM | POA: Diagnosis not present

## 2015-04-13 DIAGNOSIS — H3532 Exudative age-related macular degeneration: Secondary | ICD-10-CM | POA: Diagnosis not present

## 2015-04-16 ENCOUNTER — Telehealth: Payer: Self-pay | Admitting: Internal Medicine

## 2015-04-16 ENCOUNTER — Ambulatory Visit (INDEPENDENT_AMBULATORY_CARE_PROVIDER_SITE_OTHER): Payer: Medicare Other | Admitting: Internal Medicine

## 2015-04-16 ENCOUNTER — Encounter: Payer: Self-pay | Admitting: *Deleted

## 2015-04-16 ENCOUNTER — Encounter: Payer: Self-pay | Admitting: Internal Medicine

## 2015-04-16 VITALS — BP 126/76 | HR 71 | Temp 98.0°F | Resp 12 | Ht 60.5 in | Wt 138.2 lb

## 2015-04-16 DIAGNOSIS — H3530 Unspecified macular degeneration: Secondary | ICD-10-CM

## 2015-04-16 DIAGNOSIS — R519 Headache, unspecified: Secondary | ICD-10-CM

## 2015-04-16 DIAGNOSIS — R51 Headache: Secondary | ICD-10-CM

## 2015-04-16 MED ORDER — AMITRIPTYLINE HCL 25 MG PO TABS: 25.0000 mg | ORAL_TABLET | Freq: Every day | ORAL | Status: DC
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
## 2015-04-16 NOTE — Assessment & Plan Note (Signed)
Advised to try low dose elavil at bedtime
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
## 2015-04-16 NOTE — Telephone Encounter (Signed)
Letter Drafted in quick sign.

## 2015-04-16 NOTE — Progress Notes (Signed)
Pre-visit discussion using our clinic review tool. No additional management support is needed unless otherwise documented below in the visit note.  

## 2015-04-16 NOTE — Patient Instructions (Addendum)
I an suggesting a "trial" of generic Elavil to help your headaches and your insomnia  Take 25 mg up to one hour before bedtime.   You mammogram was ordered in April  At [place].  We will find out what happened and get it ordered again

## 2015-05-02 DIAGNOSIS — Z1231 Encounter for screening mammogram for malignant neoplasm of breast: Secondary | ICD-10-CM | POA: Diagnosis not present

## 2015-05-02 LAB — HM MAMMOGRAPHY

## 2015-05-03 ENCOUNTER — Encounter: Payer: Self-pay | Admitting: Internal Medicine

## 2015-05-07 ENCOUNTER — Telehealth: Payer: Self-pay | Admitting: Internal Medicine

## 2015-05-07 NOTE — Telephone Encounter (Signed)
Left message for patient to return call to office. 

## 2015-05-07 NOTE — Telephone Encounter (Signed)
Her mammogram was abnormal on the left.  They  want additional images, and [facility] will call her to set those up .  If she doesn't hear from them by Friday,  Please call us back

## 2015-05-08 NOTE — Telephone Encounter (Signed)
The patient has set up her mammogram appointment with [facility].

## 2015-05-18 DIAGNOSIS — R928 Other abnormal and inconclusive findings on diagnostic imaging of breast: Secondary | ICD-10-CM | POA: Diagnosis not present

## 2015-06-08 DIAGNOSIS — H353222 Exudative age-related macular degeneration, left eye, with inactive choroidal neovascularization: Secondary | ICD-10-CM | POA: Diagnosis not present

## 2015-06-11 ENCOUNTER — Telehealth: Payer: Self-pay | Admitting: *Deleted

## 2015-06-11 DIAGNOSIS — M25552 Pain in left hip: Secondary | ICD-10-CM

## 2015-06-11 NOTE — Telephone Encounter (Signed)
Please advise 

## 2015-06-11 NOTE — Telephone Encounter (Signed)
I have never evaluated her for left leg issues.  Can she be more specific?  Hip pain,  Knee pain,  Leg weakness?

## 2015-06-11 NOTE — Telephone Encounter (Signed)
Patient has requested a Rx for Physical Therapy, due to her having issues with her left leg. Patient stated that she's a resident of [place].

## 2015-06-11 NOTE — Telephone Encounter (Signed)
Left a message for the patient to return my call regarding clarity of what is going on with her leg.

## 2015-06-12 ENCOUNTER — Telehealth: Payer: Self-pay | Admitting: *Deleted

## 2015-06-12 NOTE — Telephone Encounter (Signed)
Please advise based on last note.  Thanks

## 2015-06-12 NOTE — Telephone Encounter (Signed)
Patient has been schedule for an appt with Dr. Derrel Nip  11/23 at 0930

## 2015-06-12 NOTE — Telephone Encounter (Signed)
Since this has not had a prior evaluation with me, she needs to get an x ray of the hip and see me before I   Will make the referral.  I have ordered it through [facility] . Can you give her an appointment for next week?

## 2015-06-12 NOTE — Telephone Encounter (Signed)
Patient stated that the pain left leg has pain from her hip joint that radiates to the ankle, no weakness. Pain started two weeks ago, pain increases when she walks. Patient did not have a fall or injury.

## 2015-06-13 ENCOUNTER — Encounter: Payer: Self-pay | Admitting: Internal Medicine

## 2015-06-13 ENCOUNTER — Ambulatory Visit (INDEPENDENT_AMBULATORY_CARE_PROVIDER_SITE_OTHER): Payer: Medicare Other | Admitting: Internal Medicine

## 2015-06-13 ENCOUNTER — Ambulatory Visit
Admission: RE | Admit: 2015-06-13 | Discharge: 2015-06-13 | Disposition: A | Discharge status: Home / Self Care | Payer: Medicare Other | Source: Ambulatory Visit | Attending: Internal Medicine | Admitting: Internal Medicine

## 2015-06-13 VITALS — BP 140/84 | HR 72 | Temp 97.5°F | Resp 12 | Ht 60.5 in | Wt 134.2 lb

## 2015-06-13 DIAGNOSIS — M25552 Pain in left hip: Secondary | ICD-10-CM

## 2015-06-13 DIAGNOSIS — M1612 Unilateral primary osteoarthritis, left hip: Secondary | ICD-10-CM | POA: Insufficient documentation

## 2015-06-13 DIAGNOSIS — Z23 Encounter for immunization: Secondary | ICD-10-CM

## 2015-06-13 MED ORDER — TRAMADOL HCL 50 MG PO TABS: 50.0000 mg | ORAL_TABLET | Freq: Three times a day (TID) | ORAL | Status: DC | PRN

## 2015-06-13 NOTE — Progress Notes (Signed)
Subjective:  Patient ID: Jo Burke, female    DOB: 1928-03-30  Age: 81 y.o. MRN: [id number]  CC: The primary encounter diagnosis was Hip pain, left. Diagnoses of Pain in left hip and Need for prophylactic vaccination against Streptococcus pneumoniae (pneumococcus) were also pertinent to this visit.  HPI Jo Burke presents for evaluation of leg pain.    Patient has been walking longer distances on a daily basis since her husband was moved to [facility] due to progressive dementia and CHF.   She has given up drivding since her last visit because of her vision issues and poor driving, so has been walking more.   He pain starts in the iliac crest  Above the sciatic notch and radiates down the entire leg on both the medial and the lateral side .   Outpatient Prescriptions Prior to Visit  Medication Sig Dispense Refill  . Cholecalciferol (VITAMIN D3) 1000 UNITS CAPS Take 1 capsule by mouth.    . Multiple Vitamins-Minerals (PRESERVISION AREDS) CAPS Take 1 capsule by mouth 2 (two) times daily.    . Omega-3 Fatty Acids (FISH OIL) 1000 MG CAPS Take 1 capsule by mouth 2 (two) times daily.    Marland Kitchen Raspberry Ketones 100 MG CAPS Take 1 capsule by mouth daily.    Marland Kitchen amitriptyline (ELAVIL) 25 MG tablet Take 1 tablet (25 mg total) by mouth at bedtime. (Patient not taking: Reported on 06/13/2015) 90 tablet 0  . aspirin 81 MG EC tablet Take 1 tablet (81 mg total) by mouth daily. Swallow whole. (Patient not taking: Reported on 10/23/2014) 30 tablet 12  . cyclobenzaprine (FLEXERIL) 5 MG tablet Take 1 tablet (5 mg total) by mouth at bedtime as needed for muscle spasms. (Patient not taking: Reported on 10/23/2014) 30 tablet 0  . ergocalciferol (DRISDOL) 50000 UNITS capsule Take 1 capsule (50,000 Units total) by mouth once a week. (Patient not taking: Reported on 10/23/2014) 4 capsule 3  . Tdap (BOOSTRIX) 5-2.5-18.5 LF-MCG/0.5 injection Inject 0.5 mLs into the muscle once. (Patient not taking: Reported on  04/16/2015) 0.5 mL 0   No facility-administered medications prior to visit.    Review of Systems;  Patient denies headache, fevers, malaise, unintentional weight loss, skin rash, eye pain, sinus congestion and sinus pain, sore throat, dysphagia,  hemoptysis , cough, dyspnea, wheezing, chest pain, palpitations, orthopnea, edema, abdominal pain, nausea, melena, diarrhea, constipation, flank pain, dysuria, hematuria, urinary  Frequency, nocturia, numbness, tingling, seizures,  Focal weakness, Loss of consciousness,  Tremor, insomnia, depression, anxiety, and suicidal ideation.      Objective:  BP 140/84 mmHg  Pulse 72  Temp(Src) 97.5 F (36.4 C) (Oral)  Resp 12  Ht 5' 0.5" (1.537 m)  Wt 134 lb 4 oz (60.895 kg)  BMI 25.78 kg/m2  SpO2 98%  BP Readings from Last 3 Encounters:  06/13/15 140/84  04/16/15 126/76  10/23/14 120/60    Wt Readings from Last 3 Encounters:  06/13/15 134 lb 4 oz (60.895 kg)  04/16/15 138 lb 4 oz (62.71 kg)  10/23/14 135 lb (61.236 kg)    General appearance: alert, cooperative and appears stated age Ears: normal TM's and external ear canals both ears Throat: lips, mucosa, and tongue normal; teeth and gums normal Neck: no adenopathy, no carotid bruit, supple, symmetrical, trachea midline and thyroid not enlarged, symmetric, no tenderness/mass/nodules Back: symmetric, no curvature. ROM normal. No CVA tenderness. Lungs: clear to auscultation bilaterally Heart: regular rate and rhythm, S1, S2 normal, no murmur, click, rub or gallop Abdomen:  soft, non-tender; bowel sounds normal; no masses,  no organomegaly MSK:  ROM nomral . Pain only with internal rotation  Pulses: 2+ and symmetric Skin: Skin color, texture, turgor normal. No rashes or lesions Lymph nodes: Cervical, supraclavicular, and axillary nodes normal.  No results found for: HGBA1C  Lab Results  Component Value Date   CREATININE 0.90 10/23/2014   CREATININE 0.8 12/21/2013   CREATININE 0.8  07/13/2013    Lab Results  Component Value Date   WBC 5.7 10/23/2014   HGB 13.3 10/23/2014   HCT 39.1 10/23/2014   PLT 209.0 10/23/2014   GLUCOSE 85 10/23/2014   CHOL 261* 10/23/2014   TRIG 75.0 10/23/2014   HDL 59.40 10/23/2014   LDLDIRECT 209.0 06/23/2012   LDLCALC 187* 10/23/2014   ALT 28 10/23/2014   AST 29 10/23/2014   NA 137 10/23/2014   K 4.3 10/23/2014   CL 106 10/23/2014   CREATININE 0.90 10/23/2014   BUN 22 10/23/2014   CO2 26 10/23/2014   TSH 0.96 10/23/2014   MICROALBUR 1.8 08/01/2011    No results found.  Assessment & Plan:   Problem List Items Addressed This Visit    Pain in left hip    Plain films were done today to rule out DJD of hip and fractures. .  PT ordered for therapy,  Tramadol and tylenol for pain        Other Visit Diagnoses    Hip pain, left    -  Primary    Relevant Orders    Ambulatory referral to Physical Therapy    Need for prophylactic vaccination against Streptococcus pneumoniae (pneumococcus)        Relevant Orders    Pneumococcal polysaccharide vaccine 23-valent greater than or equal to 2yo subcutaneous/IM (Completed)       I have discontinued [name]'s aspirin, ergocalciferol, Tdap, cyclobenzaprine, and amitriptyline. I am also having her start on traMADol. Additionally, I am having her maintain her PRESERVISION AREDS, Fish Oil, Raspberry Ketones, and Vitamin D3.  Meds ordered this encounter  Medications  . traMADol (ULTRAM) 50 MG tablet    Sig: Take 1 tablet (50 mg total) by mouth every 8 (eight) hours as needed.    Dispense:  90 tablet    Refill:  0    Medications Discontinued During This Encounter  Medication Reason  . Tdap (BOOSTRIX) 5-2.5-18.5 LF-MCG/0.5 injection Error  . amitriptyline (ELAVIL) 25 MG tablet   . aspirin 81 MG EC tablet   . cyclobenzaprine (FLEXERIL) 5 MG tablet   . ergocalciferol (DRISDOL) 50000 UNITS capsule   . Tdap ([place]) 5-2.5-18.5 LF-MCG/0.5 injection Error    Follow-up: No  Follow-up on file.   Crecencio Mc, MD

## 2015-06-13 NOTE — Progress Notes (Signed)
Pre-visit discussion using our clinic review tool. No additional management support is needed unless otherwise documented below in the visit note.  

## 2015-06-13 NOTE — Patient Instructions (Signed)
Your pain appears to be coming from the hip,  It may be bursitis from walking so much,  Or from a bad hip joint.   You can take 1 aleve and 2 tylenol two times daily (morning and evening) for your pain,  And use your husband's cane for support.   Physical Therapy order is in process  The x ray will help determine if the problem is a bad hip joint (DJD) Go to [facility] .  To the [place] to Radiology for you x ray.  You do not need an appointment   You can add tramadol to this regimen  if needed for pain ,  Up to 3 daily,  An Rx has been sent to your pharmacy

## 2015-06-15 NOTE — Assessment & Plan Note (Signed)
Plain films were done today to rule out DJD of hip and fractures. .  PT ordered for therapy,  Tramadol and tylenol for pain

## 2015-06-18 ENCOUNTER — Telehealth: Payer: Self-pay | Admitting: *Deleted

## 2015-06-18 NOTE — Telephone Encounter (Signed)
Patient has requested her Xray results from Wednesday.06/13/15 Patient stated that if she can not be reached, please leave results on the answering machine.

## 2015-06-18 NOTE — Telephone Encounter (Signed)
Left a detail message for patient regarding referral being placed

## 2015-06-19 NOTE — Telephone Encounter (Signed)
Pt is returning your call

## 2015-06-19 NOTE — Telephone Encounter (Signed)
Left message for patient to return call to office. 

## 2015-06-21 ENCOUNTER — Encounter: Payer: Self-pay | Admitting: Internal Medicine

## 2015-06-21 DIAGNOSIS — M5442 Lumbago with sciatica, left side: Secondary | ICD-10-CM | POA: Diagnosis not present

## 2015-06-21 DIAGNOSIS — M1990 Unspecified osteoarthritis, unspecified site: Secondary | ICD-10-CM | POA: Diagnosis not present

## 2015-07-02 DIAGNOSIS — M6281 Muscle weakness (generalized): Secondary | ICD-10-CM | POA: Diagnosis not present

## 2015-07-02 DIAGNOSIS — M25552 Pain in left hip: Secondary | ICD-10-CM | POA: Diagnosis not present

## 2015-07-02 DIAGNOSIS — R2689 Other abnormalities of gait and mobility: Secondary | ICD-10-CM | POA: Diagnosis not present

## 2015-07-04 DIAGNOSIS — R2689 Other abnormalities of gait and mobility: Secondary | ICD-10-CM | POA: Diagnosis not present

## 2015-07-04 DIAGNOSIS — M6281 Muscle weakness (generalized): Secondary | ICD-10-CM | POA: Diagnosis not present

## 2015-07-04 DIAGNOSIS — M25552 Pain in left hip: Secondary | ICD-10-CM | POA: Diagnosis not present

## 2015-07-05 DIAGNOSIS — M25552 Pain in left hip: Secondary | ICD-10-CM | POA: Diagnosis not present

## 2015-07-05 DIAGNOSIS — M6281 Muscle weakness (generalized): Secondary | ICD-10-CM | POA: Diagnosis not present

## 2015-07-05 DIAGNOSIS — R2689 Other abnormalities of gait and mobility: Secondary | ICD-10-CM | POA: Diagnosis not present

## 2015-07-11 DIAGNOSIS — M25552 Pain in left hip: Secondary | ICD-10-CM | POA: Diagnosis not present

## 2015-07-11 DIAGNOSIS — R2689 Other abnormalities of gait and mobility: Secondary | ICD-10-CM | POA: Diagnosis not present

## 2015-07-11 DIAGNOSIS — M6281 Muscle weakness (generalized): Secondary | ICD-10-CM | POA: Diagnosis not present

## 2015-07-12 DIAGNOSIS — M6281 Muscle weakness (generalized): Secondary | ICD-10-CM | POA: Diagnosis not present

## 2015-07-12 DIAGNOSIS — R2689 Other abnormalities of gait and mobility: Secondary | ICD-10-CM | POA: Diagnosis not present

## 2015-07-12 DIAGNOSIS — M25552 Pain in left hip: Secondary | ICD-10-CM | POA: Diagnosis not present

## 2015-07-18 DIAGNOSIS — R2689 Other abnormalities of gait and mobility: Secondary | ICD-10-CM | POA: Diagnosis not present

## 2015-07-18 DIAGNOSIS — M6281 Muscle weakness (generalized): Secondary | ICD-10-CM | POA: Diagnosis not present

## 2015-07-18 DIAGNOSIS — M25552 Pain in left hip: Secondary | ICD-10-CM | POA: Diagnosis not present

## 2015-07-19 ENCOUNTER — Ambulatory Visit: Payer: Medicare Other | Admitting: Podiatry

## 2015-07-19 DIAGNOSIS — M6281 Muscle weakness (generalized): Secondary | ICD-10-CM | POA: Diagnosis not present

## 2015-07-19 DIAGNOSIS — M25552 Pain in left hip: Secondary | ICD-10-CM | POA: Diagnosis not present

## 2015-07-19 DIAGNOSIS — R2689 Other abnormalities of gait and mobility: Secondary | ICD-10-CM | POA: Diagnosis not present

## 2015-07-24 ENCOUNTER — Ambulatory Visit (INDEPENDENT_AMBULATORY_CARE_PROVIDER_SITE_OTHER): Payer: PPO | Admitting: Podiatry

## 2015-07-24 ENCOUNTER — Encounter: Payer: Self-pay | Admitting: Podiatry

## 2015-07-24 VITALS — BP 101/63 | HR 69 | Resp 18

## 2015-07-24 DIAGNOSIS — B351 Tinea unguium: Secondary | ICD-10-CM

## 2015-07-24 DIAGNOSIS — M79674 Pain in right toe(s): Secondary | ICD-10-CM

## 2015-07-24 DIAGNOSIS — M79675 Pain in left toe(s): Secondary | ICD-10-CM

## 2015-07-24 DIAGNOSIS — M2170 Unequal limb length (acquired), unspecified site: Secondary | ICD-10-CM

## 2015-07-24 NOTE — Progress Notes (Signed)
Subjective:     Patient ID: Aris Strelow, female   DOB: [birth year], 82 y.o.   MRN: [id number]  HPI 82 year old female presents the office today with complaints of thick, long gait, discolored toenails which cause irritation shoe gear. She cannot trim them herself and she is asking for trim. She also states that she was told by her physical therapist that her left leg was somewhat shorter than her right side and she is asking for a heel lift. She has no other pain to her feet. She was having some hip pain which is apparently activated by her limb length discrepancy. No other complaints at this time.  Review of Systems  All other systems reviewed and are negative.      Objective:   Physical Exam General: AAO x3, NAD  Dermatological: Nails are hypertrophic, dystrophic, brittle, discolored, elongated 10. No surrounding erythema or drainage. There is tenderness nails 1-5 bilaterally. No other open lesions or pre-ulcerative lesions.  Vascular: Dorsalis Pedis artery and Posterior Tibial artery pedal pulses are 2/4 bilateral with immedate capillary fill time. Pedal hair growth present. There is no pain with calf compression, swelling, warmth, erythema.   Neruologic: Grossly intact via light touch bilateral. Vibratory intact via tuning fork bilateral. Protective threshold with Semmes Wienstein monofilament intact to all pedal sites bilateral. Patellar and Achilles deep tendon reflexes 2+ bilateral.   Musculoskeletal: The left leg is approximately 0.5 cm shorter than the right leg for measuring from the umbilicus to the medial malleolus. No gross boney pedal deformities bilateral. No pain, crepitus, or limitation noted with foot and ankle range of motion bilateral. Muscular strength 5/5 in all groups tested bilateral.  Gait: Unassisted, Nonantalgic.      Assessment:     Symptomatic onychomycosis, limb length discrepancy    Plan:     -Treatment options discussed including all alternatives,  risks, and complications -Etiology of symptoms were discussed -Dispensed heel lift. Discussed orthotic however she wishes to hold off on that at this point. -Nail sharply debrided 10 without complications/bleeding -Follow-up in 3 months or sooner if any problems arise. In the meantime, encouraged to call the office with any questions, concerns, change in symptoms.   Celesta Gentile, DPM

## 2015-08-17 DIAGNOSIS — H353222 Exudative age-related macular degeneration, left eye, with inactive choroidal neovascularization: Secondary | ICD-10-CM | POA: Diagnosis not present

## 2015-10-23 ENCOUNTER — Ambulatory Visit: Payer: PPO | Admitting: Podiatry

## 2015-11-05 DIAGNOSIS — H353112 Nonexudative age-related macular degeneration, right eye, intermediate dry stage: Secondary | ICD-10-CM | POA: Diagnosis not present

## 2015-11-05 DIAGNOSIS — H353222 Exudative age-related macular degeneration, left eye, with inactive choroidal neovascularization: Secondary | ICD-10-CM | POA: Diagnosis not present

## 2016-01-30 DIAGNOSIS — H40003 Preglaucoma, unspecified, bilateral: Secondary | ICD-10-CM | POA: Diagnosis not present

## 2016-02-12 DIAGNOSIS — I8393 Asymptomatic varicose veins of bilateral lower extremities: Secondary | ICD-10-CM | POA: Diagnosis not present

## 2016-02-12 DIAGNOSIS — L821 Other seborrheic keratosis: Secondary | ICD-10-CM | POA: Diagnosis not present

## 2016-02-12 DIAGNOSIS — D1800 Hemangioma unspecified site: Secondary | ICD-10-CM | POA: Diagnosis not present

## 2016-02-12 DIAGNOSIS — L814 Other melanin hyperpigmentation: Secondary | ICD-10-CM | POA: Diagnosis not present

## 2016-02-12 DIAGNOSIS — Z1283 Encounter for screening for malignant neoplasm of skin: Secondary | ICD-10-CM | POA: Diagnosis not present

## 2016-02-12 DIAGNOSIS — L570 Actinic keratosis: Secondary | ICD-10-CM | POA: Diagnosis not present

## 2016-02-12 DIAGNOSIS — D485 Neoplasm of uncertain behavior of skin: Secondary | ICD-10-CM | POA: Diagnosis not present

## 2016-02-12 DIAGNOSIS — Z85828 Personal history of other malignant neoplasm of skin: Secondary | ICD-10-CM | POA: Diagnosis not present

## 2016-02-12 DIAGNOSIS — L820 Inflamed seborrheic keratosis: Secondary | ICD-10-CM | POA: Diagnosis not present

## 2016-03-18 ENCOUNTER — Ambulatory Visit (INDEPENDENT_AMBULATORY_CARE_PROVIDER_SITE_OTHER): Payer: PPO

## 2016-03-18 ENCOUNTER — Encounter: Payer: Self-pay | Admitting: Family

## 2016-03-18 ENCOUNTER — Ambulatory Visit (INDEPENDENT_AMBULATORY_CARE_PROVIDER_SITE_OTHER): Payer: PPO | Admitting: Family

## 2016-03-18 VITALS — BP 140/60 | HR 74 | Temp 97.4°F | Wt 134.2 lb

## 2016-03-18 DIAGNOSIS — R05 Cough: Secondary | ICD-10-CM

## 2016-03-18 DIAGNOSIS — R059 Cough, unspecified: Secondary | ICD-10-CM

## 2016-03-18 DIAGNOSIS — R0602 Shortness of breath: Secondary | ICD-10-CM | POA: Diagnosis not present

## 2016-03-18 MED ORDER — DOXYCYCLINE HYCLATE 100 MG PO TABS: 100.0000 mg | ORAL_TABLET | Freq: Two times a day (BID) | ORAL | 0 refills | Status: DC

## 2016-03-18 NOTE — Progress Notes (Signed)
Subjective:    Patient ID: Jo Burke, female    DOB: [birth year], 82 y.o.   MRN: [id number]  CC: Jo Burke is a 82 y.o. female who presents today for an acute visit.    HPI: Patient here for acute visit with chief complaint of clear productive cough, congestion 2.5 weeks. Cough waxes and waynes. Endorses a 'little SOB', HA, body aches, fatigue, chest tightness, malaise. No history of lung disease.Has been taking tyelonol, aleve, OTC cough syrup, cough drops without relief. Denies exertional chest pain or pressure, numbness or tingling radiating to left arm or jaw, palpitations, dizziness, changes in vision.  Prone to HA's. Frontal HA. Not the worse HA of life.     HISTORY:  Past Medical History:  Diagnosis Date  . Macular degeneration, left eye 1992   [place],    Past Surgical History:  Procedure Laterality Date  . vocal cord polyp     1962   Family History  Problem Relation Age of Onset  . Cancer Mother 88    Breast  . COPD Father   . Cancer Father     lung  . Cancer Brother 96    liver,     Allergies: Sulfa antibiotics Current Outpatient Prescriptions on File Prior to Visit  Medication Sig Dispense Refill  . Cholecalciferol (VITAMIN D3) 1000 UNITS CAPS Take 1 capsule by mouth.    . Multiple Vitamins-Minerals (PRESERVISION AREDS) CAPS Take 1 capsule by mouth 2 (two) times daily.    . Omega-3 Fatty Acids (FISH OIL) 1000 MG CAPS Take 1 capsule by mouth 2 (two) times daily.    Marland Kitchen Raspberry Ketones 100 MG CAPS Take 1 capsule by mouth daily.    . traMADol (ULTRAM) 50 MG tablet Take 1 tablet (50 mg total) by mouth every 8 (eight) hours as needed. 90 tablet 0   No current facility-administered medications on file prior to visit.     Social History  Substance Use Topics  . Smoking status: Former Research scientist (life sciences)  . Smokeless tobacco: Not on file  . Alcohol use 3.0 oz/week    5 Glasses of wine per week    Review of Systems  Constitutional: Negative for chills and  fever.  HENT: Positive for congestion.   Respiratory: Positive for cough.   Cardiovascular: Negative for chest pain and palpitations.  Gastrointestinal: Negative for nausea and vomiting.      Objective:    BP 140/60   Pulse 74   Temp 97.4 F (36.3 C) (Oral)   Wt 134 lb 3.2 oz (60.9 kg)   SpO2 98%   BMI 25.78 kg/m    Physical Exam  Constitutional: She appears well-developed and well-nourished.  HENT:  Head: Normocephalic and atraumatic.  Right Ear: Hearing, tympanic membrane, external ear and ear canal normal. No drainage, swelling or tenderness. No foreign bodies. Tympanic membrane is not erythematous and not bulging. No middle ear effusion. No decreased hearing is noted.  Left Ear: Hearing, tympanic membrane, external ear and ear canal normal. No drainage, swelling or tenderness. No foreign bodies. Tympanic membrane is not erythematous and not bulging.  No middle ear effusion. No decreased hearing is noted.  Nose: Nose normal. No rhinorrhea. Right sinus exhibits no maxillary sinus tenderness and no frontal sinus tenderness. Left sinus exhibits no maxillary sinus tenderness and no frontal sinus tenderness.  Mouth/Throat: Uvula is midline, oropharynx is clear and moist and mucous membranes are normal. No oropharyngeal exudate, posterior oropharyngeal edema, posterior oropharyngeal erythema or tonsillar abscesses.  Eyes:  Conjunctivae are normal.  Cardiovascular: Regular rhythm, normal heart sounds and normal pulses.   Pulmonary/Chest: Effort normal. She has decreased breath sounds in the left middle field and the left lower field. She has no wheezes. She has no rhonchi. She has no rales.  Lymphadenopathy:       Head (right side): No submental, no submandibular, no tonsillar, no preauricular, no posterior auricular and no occipital adenopathy present.       Head (left side): No submental, no submandibular, no tonsillar, no preauricular, no posterior auricular and no occipital adenopathy  present.    She has no cervical adenopathy.  Neurological: She is alert.  Skin: Skin is warm and dry.  Psychiatric: She has a normal mood and affect. Her speech is normal and behavior is normal. Thought content normal.  Vitals reviewed.      Assessment & Plan:   1. Cough Working diagnosis of bacterial URI supported by duration of symptoms. CXR negative for PNA however does show degree of COPD.  Afebrile. SaO2 98%. Reassured by normal EKG. Sinus rhythm. 69 BPM. No ischemia. Patient politely declined inhaler. Started patient on empiric therapy doxycycline to cover for CA-PNA.   - EKG 12-Lead - DG Chest 2 View - doxycycline (VIBRA-TABS) 100 MG tablet; Take 1 tablet (100 mg total) by mouth 2 (two) times daily.  Dispense: 10 tablet; Refill: 0    I am having Ms. Bowermaster start on doxycycline. I am also having her maintain her PRESERVISION AREDS, Fish Oil, Raspberry Ketones, Vitamin D3, and traMADol.   Meds ordered this encounter  Medications  . doxycycline (VIBRA-TABS) 100 MG tablet    Sig: Take 1 tablet (100 mg total) by mouth 2 (two) times daily.    Dispense:  10 tablet    Refill:  0    Order Specific Question:   Supervising Provider    Answer:   Crecencio Mc [[id number]]    Return precautions given.   Risks, benefits, and alternatives of the medications and treatment plan prescribed today were discussed, and patient expressed understanding.   Education regarding symptom management and diagnosis given to patient on AVS.  Continue to follow with TULLO, Aris Everts, MD for routine health maintenance.   Diannia Ruder and I agreed with plan.   Mable Paris, FNP

## 2016-03-18 NOTE — Patient Instructions (Signed)
pleasure meeting you today. I suspect pneumonia and I'm treating for pneumonia. Pending chest x-ray. Please use honey to control cough. Lots of water and may use over-the-counter, plain Mucinex to break up mucus if it becomes thick.  If there is no improvement in your symptoms, or if there is any worsening of symptoms, or if you have any additional concerns, please return for re-evaluation; or, if we are closed, consider going to the Emergency Room for evaluation if symptoms urgent.   Community-Acquired Pneumonia, Adult Pneumonia is an infection of the lungs. There are different types of pneumonia. One type can develop while a person is in a hospital. A different type, called community-acquired pneumonia, develops in people who are not, or have not recently been, in the hospital or other health care facility.  CAUSES Pneumonia may be caused by bacteria, viruses, or funguses. Community-acquired pneumonia is often caused by Streptococcus pneumonia bacteria. These bacteria are often passed from one person to another by breathing in droplets from the cough or sneeze of an infected person. RISK FACTORS The condition is more likely to develop in:  People who havechronic diseases, such as chronic obstructive pulmonary disease (COPD), asthma, congestive heart failure, cystic fibrosis, diabetes, or kidney disease.  People who haveearly-stage or late-stage HIV.  People who havesickle cell disease.  People who havehad their spleen removed (splenectomy).  People who havepoor Human resources officer.  People who havemedical conditions that increase the risk of breathing in (aspirating) secretions their own mouth and nose.   People who havea weakened immune system (immunocompromised).  People who smoke.  People whotravel to areas where pneumonia-causing germs commonly exist.  People whoare around animal habitats or animals that have pneumonia-causing germs, including birds, bats, rabbits, cats, and  farm animals. SYMPTOMS Symptoms of this condition include:  Adry cough.  A wet (productive) cough.  Fever.  Sweating.  Chest pain, especially when breathing deeply or coughing.  Rapid breathing or difficulty breathing.  Shortness of breath.  Shaking chills.  Fatigue.  Muscle aches. DIAGNOSIS Your health care provider will take a medical history and perform a physical exam. You may also have other tests, including:  Imaging studies of your chest, including X-rays.  Tests to check your blood oxygen level and other blood gases.  Other tests on blood, mucus (sputum), fluid around your lungs (pleural fluid), and urine. If your pneumonia is severe, other tests may be done to identify the specific cause of your illness. TREATMENT The type of treatment that you receive depends on many factors, such as the cause of your pneumonia, the medicines you take, and other medical conditions that you have. For most adults, treatment and recovery from pneumonia may occur at home. In some cases, treatment must happen in a hospital. Treatment may include:  Antibiotic medicines, if the pneumonia was caused by bacteria.  Antiviral medicines, if the pneumonia was caused by a virus.  Medicines that are given by mouth or through an IV tube.  Oxygen.  Respiratory therapy. Although rare, treating severe pneumonia may include:  Mechanical ventilation. This is done if you are not breathing well on your own and you cannot maintain a safe blood oxygen level.  Thoracentesis. This procedureremoves fluid around one lung or both lungs to help you breathe better. HOME CARE INSTRUCTIONS  Take over-the-counter and prescription medicines only as told by your health care provider.  Only takecough medicine if you are losing sleep. Understand that cough medicine can prevent your body's natural ability to remove mucus from  your lungs.  If you were prescribed an antibiotic medicine, take it as told by  your health care provider. Do not stop taking the antibiotic even if you start to feel better.  Sleep in a semi-upright position at night. Try sleeping in a reclining chair, or place a few pillows under your head.  Do not use tobacco products, including cigarettes, chewing tobacco, and e-cigarettes. If you need help quitting, ask your health care provider.  Drink enough water to keep your urine clear or pale yellow. This will help to thin out mucus secretions in your lungs. PREVENTION There are ways that you can decrease your risk of developing community-acquired pneumonia. Consider getting a pneumococcal vaccine if:  You are older than 80 years of age.  You are older than 80 years of age and are undergoing cancer treatment, have chronic lung disease, or have other medical conditions that affect your immune system. Ask your health care provider if this applies to you. There are different types and schedules of pneumococcal vaccines. Ask your health care provider which vaccination option is best for you. You may also prevent community-acquired pneumonia if you take these actions:  Get an influenza vaccine every year. Ask your health care provider which type of influenza vaccine is best for you.  Go to the dentist on a regular basis.  Wash your hands often. Use hand sanitizer if soap and water are not available. SEEK MEDICAL CARE IF:  You have a fever.  You are losing sleep because you cannot control your cough with cough medicine. SEEK IMMEDIATE MEDICAL CARE IF:  You have worsening shortness of breath.  You have increased chest pain.  Your sickness becomes worse, especially if you are an older adult or have a weakened immune system.  You cough up blood.   This information is not intended to replace advice given to you by your health care provider. Make sure you discuss any questions you have with your health care provider.   Document Released: 07/07/2005 Document Revised:  03/28/2015 Document Reviewed: 11/01/2014 Elsevier Interactive Patient Education [employer].

## 2016-04-02 ENCOUNTER — Encounter: Payer: Self-pay | Admitting: Internal Medicine

## 2016-04-02 ENCOUNTER — Ambulatory Visit (INDEPENDENT_AMBULATORY_CARE_PROVIDER_SITE_OTHER): Payer: PPO | Admitting: Internal Medicine

## 2016-04-02 VITALS — BP 124/64 | HR 83 | Temp 97.7°F | Resp 16 | Ht 59.75 in | Wt 132.4 lb

## 2016-04-02 DIAGNOSIS — R5383 Other fatigue: Secondary | ICD-10-CM

## 2016-04-02 DIAGNOSIS — R351 Nocturia: Secondary | ICD-10-CM

## 2016-04-02 DIAGNOSIS — Z0000 Encounter for general adult medical examination without abnormal findings: Secondary | ICD-10-CM | POA: Diagnosis not present

## 2016-04-02 DIAGNOSIS — M25552 Pain in left hip: Secondary | ICD-10-CM

## 2016-04-02 DIAGNOSIS — E559 Vitamin D deficiency, unspecified: Secondary | ICD-10-CM

## 2016-04-02 MED ORDER — ALPRAZOLAM 0.25 MG PO TABS: ORAL_TABLET | ORAL | 2 refills | Status: DC

## 2016-04-02 NOTE — Patient Instructions (Signed)
I will make a referral to Dr Donney Rankins to help your left hip pain      Menopause is a normal process in which your reproductive ability comes to an end. This process happens gradually over a span of months to years, usually between the ages of 50 and 65. Menopause is complete when you have missed 12 consecutive menstrual periods. It is important to talk with your health care provider about some of the most common conditions that affect postmenopausal women, such as heart disease, cancer, and bone loss (osteoporosis). Adopting a healthy lifestyle and getting preventive care can help to promote your health and wellness. Those actions can also lower your chances of developing some of these common conditions. WHAT SHOULD I KNOW ABOUT MENOPAUSE? During menopause, you may experience a number of symptoms, such as:  Moderate-to-severe hot flashes.  Night sweats.  Decrease in sex drive.  Mood swings.  Headaches.  Tiredness.  Irritability.  Memory problems.  Insomnia. Choosing to treat or not to treat menopausal changes is an individual decision that you make with your health care provider. WHAT SHOULD I KNOW ABOUT HORMONE REPLACEMENT THERAPY AND SUPPLEMENTS? Hormone therapy products are effective for treating symptoms that are associated with menopause, such as hot flashes and night sweats. Hormone replacement carries certain risks, especially as you become older. If you are thinking about using estrogen or estrogen with progestin treatments, discuss the benefits and risks with your health care provider. WHAT SHOULD I KNOW ABOUT HEART DISEASE AND STROKE? Heart disease, heart attack, and stroke become more likely as you age. This may be due, in part, to the hormonal changes that your body experiences during menopause. These can affect how your body processes dietary fats, triglycerides, and cholesterol. Heart attack and stroke are both medical emergencies. There are many things that you can do  to help prevent heart disease and stroke:  Have your blood pressure checked at least every 1-2 years. High blood pressure causes heart disease and increases the risk of stroke.  If you are 20-59 years old, ask your health care provider if you should take aspirin to prevent a heart attack or a stroke.  Do not use any tobacco products, including cigarettes, chewing tobacco, or electronic cigarettes. If you need help quitting, ask your health care provider.  It is important to eat a healthy diet and maintain a healthy weight.  Be sure to include plenty of vegetables, fruits, low-fat dairy products, and lean protein.  Avoid eating foods that are high in solid fats, added sugars, or salt (sodium).  Get regular exercise. This is one of the most important things that you can do for your health.  Try to exercise for at least 150 minutes each week. The type of exercise that you do should increase your heart rate and make you sweat. This is known as moderate-intensity exercise.  Try to do strengthening exercises at least twice each week. Do these in addition to the moderate-intensity exercise.  Know your numbers.Ask your health care provider to check your cholesterol and your blood glucose. Continue to have your blood tested as directed by your health care provider. WHAT SHOULD I KNOW ABOUT CANCER SCREENING? There are several types of cancer. Take the following steps to reduce your risk and to catch any cancer development as early as possible. Breast Cancer  Practice breast self-awareness.  This means understanding how your breasts normally appear and feel.  It also means doing regular breast self-exams. Let your health care provider know  about any changes, no matter how small.  If you are 44 or older, have a clinician do a breast exam (clinical breast exam or CBE) every year. Depending on your age, family history, and medical history, it may be recommended that you also have a yearly breast  X-ray (mammogram).  If you have a family history of breast cancer, talk with your health care provider about genetic screening.  If you are at high risk for breast cancer, talk with your health care provider about having an MRI and a mammogram every year.  Breast cancer (BRCA) gene test is recommended for women who have family members with BRCA-related cancers. Results of the assessment will determine the need for genetic counseling and BRCA1 and for BRCA2 testing. BRCA-related cancers include these types:  Breast. This occurs in males or females.  Ovarian.  Tubal. This may also be called fallopian tube cancer.  Cancer of the abdominal or pelvic lining (peritoneal cancer).  Prostate.  Pancreatic. Cervical, Uterine, and Ovarian Cancer Your health care provider may recommend that you be screened regularly for cancer of the pelvic organs. These include your ovaries, uterus, and vagina. This screening involves a pelvic exam, which includes checking for microscopic changes to the surface of your cervix (Pap test).  For women ages 21-65, health care providers may recommend a pelvic exam and a Pap test every three years. For women ages 86-65, they may recommend the Pap test and pelvic exam, combined with testing for human papilloma virus (HPV), every five years. Some types of HPV increase your risk of cervical cancer. Testing for HPV may also be done on women of any age who have unclear Pap test results.  Other health care providers may not recommend any screening for nonpregnant women who are considered low risk for pelvic cancer and have no symptoms. Ask your health care provider if a screening pelvic exam is right for you.  If you have had past treatment for cervical cancer or a condition that could lead to cancer, you need Pap tests and screening for cancer for at least 20 years after your treatment. If Pap tests have been discontinued for you, your risk factors (such as having a new sexual  partner) need to be reassessed to determine if you should start having screenings again. Some women have medical problems that increase the chance of getting cervical cancer. In these cases, your health care provider may recommend that you have screening and Pap tests more often.  If you have a family history of uterine cancer or ovarian cancer, talk with your health care provider about genetic screening.  If you have vaginal bleeding after reaching menopause, tell your health care provider.  There are currently no reliable tests available to screen for ovarian cancer. Lung Cancer Lung cancer screening is recommended for adults 69-81 years old who are at high risk for lung cancer because of a history of smoking. A yearly low-dose CT scan of the lungs is recommended if you:  Currently smoke.  Have a history of at least 30 pack-years of smoking and you currently smoke or have quit within the past 15 years. A pack-year is smoking an average of one pack of cigarettes per day for one year. Yearly screening should:  Continue until it has been 15 years since you quit.  Stop if you develop a health problem that would prevent you from having lung cancer treatment. Colorectal Cancer  This type of cancer can be detected and can often be prevented.  Routine colorectal cancer screening usually begins at age 33 and continues through age 25.  If you have risk factors for colon cancer, your health care provider may recommend that you be screened at an earlier age.  If you have a family history of colorectal cancer, talk with your health care provider about genetic screening.  Your health care provider may also recommend using home test kits to check for hidden blood in your stool.  A small camera at the end of a tube can be used to examine your colon directly (sigmoidoscopy or colonoscopy). This is done to check for the earliest forms of colorectal cancer.  Direct examination of the colon should be  repeated every 5-10 years until age 58. However, if early forms of precancerous polyps or small growths are found or if you have a family history or genetic risk for colorectal cancer, you may need to be screened more often. Skin Cancer  Check your skin from head to toe regularly.  Monitor any moles. Be sure to tell your health care provider:  About any new moles or changes in moles, especially if there is a change in a mole's shape or color.  If you have a mole that is larger than the size of a pencil eraser.  If any of your family members has a history of skin cancer, especially at a young age, talk with your health care provider about genetic screening.  Always use sunscreen. Apply sunscreen liberally and repeatedly throughout the day.  Whenever you are outside, protect yourself by wearing long sleeves, pants, a wide-brimmed hat, and sunglasses. WHAT SHOULD I KNOW ABOUT OSTEOPOROSIS? Osteoporosis is a condition in which bone destruction happens more quickly than new bone creation. After menopause, you may be at an increased risk for osteoporosis. To help prevent osteoporosis or the bone fractures that can happen because of osteoporosis, the following is recommended:  If you are 65-57 years old, get at least 1,000 mg of calcium and at least 600 mg of vitamin D per day.  If you are older than age 65 but younger than age 49, get at least 1,200 mg of calcium and at least 600 mg of vitamin D per day.  If you are older than age 68, get at least 1,200 mg of calcium and at least 800 mg of vitamin D per day. Smoking and excessive alcohol intake increase the risk of osteoporosis. Eat foods that are rich in calcium and vitamin D, and do weight-bearing exercises several times each week as directed by your health care provider. WHAT SHOULD I KNOW ABOUT HOW MENOPAUSE AFFECTS MY MENTAL HEALTH? Depression may occur at any age, but it is more common as you become older. Common symptoms of depression  include:  Low or sad mood.  Changes in sleep patterns.  Changes in appetite or eating patterns.  Feeling an overall lack of motivation or enjoyment of activities that you previously enjoyed.  Frequent crying spells. Talk with your health care provider if you think that you are experiencing depression. WHAT SHOULD I KNOW ABOUT IMMUNIZATIONS? It is important that you get and maintain your immunizations. These include:  Tetanus, diphtheria, and pertussis (Tdap) booster vaccine.  Influenza every year before the flu season begins.  Pneumonia vaccine.  Shingles vaccine. Your health care provider may also recommend other immunizations.   This information is not intended to replace advice given to you by your health care provider. Make sure you discuss any questions you have with your health care  provider.   Document Released: 08/29/2005 Document Revised: 07/28/2014 Document Reviewed: 03/09/2014 Elsevier Interactive Patient Education [employer].

## 2016-04-02 NOTE — Progress Notes (Signed)
Patient ID: Myles Terrazas, female    DOB: 1927/11/06  Age: 82 y.o. MRN: [id number]  The patient is here for annual Medicare wellness examination and management of other chronic and acute problems.  Last seen in Nov 2016 for hip pain filsm show degenerative changes,   Saw Margaret 2 weeks ago for cough  Husband Ken died day after thanksgiving of AD and she devleoped severe left hip pain afterward/  Getting on ok,  Doesn't drive.   Living, will   DNR .Marland Kitchen  Daughter  [name] is an  Therapist, sports  In [place]  and has healthcare POA    The risk factors are reflected in the social history.  The roster of all physicians providing medical care to patient - is listed in the Snapshot section of the chart.  Activities of daily living:  The patient is 100% independent in all ADLs: dressing, toileting, feeding as well as independent mobility.  Cannot drive due to MD>   Cannot walk over 0.5 mile because of left hip pain,  Worse walking uphill.   Home safety : The patient has smoke detectors in the home. They wear seatbelts.  There are no firearms at home. There is no violence in the home.   There is no risks for hepatitis, STDs or HIV. There is no   history of blood transfusion. They have no travel history to infectious disease endemic areas of the world.  The patient has seen their dentist in the last six month. They have seen their eye doctor in the last year. They admit to slight hearing difficulty with regard to whispered voices and some television programs.  They have deferred audiologic testing in the last year.  They do not  have excessive sun exposure. Discussed the need for sun protection: hats, long sleeves and use of sunscreen if there is significant sun exposure.   Diet: the importance of a healthy diet is discussed. They do have a healthy diet.  The benefits of regular aerobic exercise were discussed. She has become sedentary due to left hip pain . And non driving status due to macular degeneration     Depression screen: there are no signs or vegative symptoms of depression- irritability, change in appetite, anhedonia, sadness/tearfullness.  Cognitive assessment: the patient manages all their financial and personal affairs and is actively engaged. They could relate day,date,year and events; recalled 2/3 objects at 3 minutes; performed clock-face test normally.  The following portions of the patient's history were reviewed and updated as appropriate: allergies, current medications, past family history, past medical history,  past surgical history, past social history  and problem list.  Visual acuity was not assessed per patient preference since she has regular follow up with her ophthalmologist. Hearing and body mass index were assessed and reviewed.   During the course of the visit the patient was educatedandnd counseled about appropriate screening and preventive services including : fall prevention , diabetes screening, nutrition counseling, colorectal cancer screening, and recommended immunizations.    CC: The primary encounter diagnosis was Other fatigue. Diagnoses of Vitamin D deficiency, Nocturia, Pain in left hip, Routine general medical examination at a health care facility, and Nocturia more than twice per night were also pertinent to this visit.  Insomnia: "nothing works for me"  Elavil doesn't work until 3 am .  Has tried melatonin.    No prior trial of alprazolam   Left Leg pain at night , sharp , entire lower leg,  Treated with aleve and  black  strap molassess History Leimomi has a past medical history of Macular degeneration, left eye (1992).   She has a past surgical history that includes vocal cord polyp.   Her family history includes COPD in her father; Cancer in her father; Cancer (age of onset: 40) in her brother; Cancer (age of onset: 33) in her mother.She reports that she has quit smoking. She has never used smokeless tobacco. She reports that she drinks about 3.0 oz of  alcohol per week . She reports that she does not use drugs.  Outpatient Medications Prior to Visit  Medication Sig Dispense Refill  . Cholecalciferol (VITAMIN D3) 1000 UNITS CAPS Take 1 capsule by mouth.    . Multiple Vitamins-Minerals (PRESERVISION AREDS) CAPS Take 1 capsule by mouth 2 (two) times daily.    . Omega-3 Fatty Acids (FISH OIL) 1000 MG CAPS Take 1 capsule by mouth 2 (two) times daily.    Marland Kitchen Raspberry Ketones 100 MG CAPS Take 1 capsule by mouth daily.    . traMADol (ULTRAM) 50 MG tablet Take 1 tablet (50 mg total) by mouth every 8 (eight) hours as needed. (Patient not taking: Reported on 04/02/2016) 90 tablet 0  . doxycycline (VIBRA-TABS) 100 MG tablet Take 1 tablet (100 mg total) by mouth 2 (two) times daily. 10 tablet 0   No facility-administered medications prior to visit.     Review of Systems   Patient denies headache, fevers, malaise, unintentional weight loss, skin rash, eye pain, sinus congestion and sinus pain, sore throat, dysphagia,  hemoptysis , cough, dyspnea, wheezing, chest pain, palpitations, orthopnea, edema, abdominal pain, nausea, melena, diarrhea, constipation, flank pain, dysuria, hematuria, urinary  Frequency, nocturia, numbness, tingling, seizures,  Focal weakness, Loss of consciousness,  Tremor, insomnia, depression, anxiety, and suicidal ideation.      Objective:  BP 124/64 (BP Location: Right Arm, Patient Position: Sitting, Cuff Size: Normal)   Pulse 83   Temp 97.7 F (36.5 C) (Oral)   Resp 16   Ht 4' 11.75" (1.518 m)   Wt 132 lb 6.4 oz (60.1 kg)   SpO2 96%   BMI 26.07 kg/m   Physical Exam   General appearance: alert, cooperative and appears stated age Ears: normal TM's and external ear canals both ears Throat: lips, mucosa, and tongue normal; teeth and gums normal Neck: no adenopathy, no carotid bruit, supple, symmetrical, trachea midline and thyroid not enlarged, symmetric, no tenderness/mass/nodules Back: symmetric, no curvature. ROM  normal. No CVA tenderness. Lungs: clear to auscultation bilaterally Heart: regular rate and rhythm, S1, S2 normal, no murmur, click, rub or gallop Abdomen: soft, non-tender; bowel sounds normal; no masses,  no organomegaly Pulses: 2+ and symmetric Skin: Skin color, texture, turgor normal. No rashes or lesions Lymph nodes: Cervical, supraclavicular, and axillary nodes normal.   Assessment & Plan:   Problem List Items Addressed This Visit    Routine general medical examination at a health care facility    Annual Medicare wellness  exam was done as well as a comprehensive physical exam and management of acute and chronic conditions .  During the course of the visit the patient was educated and counseled about appropriate screening and preventive services including : fall prevention , diabetes screening, nutrition counseling, colorectal cancer screening, and recommended immunizations.  Printed recommendations for health maintenance screenings was given.       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 . Referral to Dr Mack Guise for steroid  ijection       Nocturia more than twice per night    She was unable to provide a urine specimen today and will return a sample       Vitamin D deficiency   Relevant Orders   VITAMIN D 25 Hydroxy (Vit-D Deficiency, Fractures) (Completed)    Other Visit Diagnoses    Other fatigue    -  Primary   Relevant Orders   CBC with Differential/Platelet (Completed)   Comprehensive metabolic panel (Completed)   TSH (Completed)   Vitamin B12 (Completed)   Nocturia       Relevant Orders   POCT urinalysis dipstick      I have discontinued Ms. Cohoon's doxycycline. I am also having her start on ALPRAZolam. Additionally, I am having her maintain her PRESERVISION AREDS, Fish Oil, Raspberry Ketones, Vitamin D3, and traMADol.  Meds ordered this encounter  Medications  . ALPRAZolam (XANAX) 0.25 MG tablet    Sig: 1 to 2  tablets at bedtime as needed for insomnia    Dispense:  60 tablet    Refill:  2    Medications Discontinued During This Encounter  Medication Reason  . doxycycline (VIBRA-TABS) 100 MG tablet Completed Course    Follow-up: No Follow-up on file.   Crecencio Mc, MD

## 2016-04-03 ENCOUNTER — Telehealth: Payer: Self-pay | Admitting: *Deleted

## 2016-04-03 LAB — CBC WITH DIFFERENTIAL/PLATELET
BASOS ABS: 0 10*3/uL (ref 0.0–0.1)
BASOS PCT: 0.3 % (ref 0.0–3.0)
EOS PCT: 2.5 % (ref 0.0–5.0)
Eosinophils Absolute: 0.1 10*3/uL (ref 0.0–0.7)
HEMATOCRIT: 40 % (ref 36.0–46.0)
Hemoglobin: 13.4 g/dL (ref 12.0–15.0)
LYMPHS PCT: 28 % (ref 12.0–46.0)
Lymphs Abs: 1.5 10*3/uL (ref 0.7–4.0)
MCHC: 33.5 g/dL (ref 30.0–36.0)
MCV: 88.2 fl (ref 78.0–100.0)
Monocytes Absolute: 0.5 10*3/uL (ref 0.1–1.0)
Monocytes Relative: 8.9 % (ref 3.0–12.0)
Neutro Abs: 3.3 10*3/uL (ref 1.4–7.7)
Neutrophils Relative %: 60.3 % (ref 43.0–77.0)
PLATELETS: 237 10*3/uL (ref 150.0–400.0)
RBC: 4.53 Mil/uL (ref 3.87–5.11)
RDW: 14.8 % (ref 11.5–15.5)
WBC: 5.5 10*3/uL (ref 4.0–10.5)

## 2016-04-03 LAB — COMPREHENSIVE METABOLIC PANEL
ALK PHOS: 64 U/L (ref 39–117)
ALT: 18 U/L (ref 0–35)
AST: 20 U/L (ref 0–37)
Albumin: 4.2 g/dL (ref 3.5–5.2)
BILIRUBIN TOTAL: 0.6 mg/dL (ref 0.2–1.2)
BUN: 25 mg/dL — ABNORMAL HIGH (ref 6–23)
CALCIUM: 9.4 mg/dL (ref 8.4–10.5)
CO2: 27 meq/L (ref 19–32)
Chloride: 104 mEq/L (ref 96–112)
Creatinine, Ser: 0.9 mg/dL (ref 0.40–1.20)
GFR: 63.07 mL/min (ref >60.00)
Glucose, Bld: 88 mg/dL (ref 70–99)
Potassium: 4.3 mEq/L (ref 3.5–5.1)
Sodium: 138 mEq/L (ref 135–145)
Total Protein: 7 g/dL (ref 6.0–8.3)

## 2016-04-03 LAB — TSH: TSH: 0.92 u[IU]/mL (ref 0.35–4.50)

## 2016-04-03 LAB — VITAMIN B12: VITAMIN B 12: 255 pg/mL (ref 211–911)

## 2016-04-03 LAB — VITAMIN D 25 HYDROXY (VIT D DEFICIENCY, FRACTURES): VITD: 43.1 ng/mL (ref 30.00–100.00)

## 2016-04-03 NOTE — Telephone Encounter (Signed)
Printed and  Mailed to patient.

## 2016-04-03 NOTE — Telephone Encounter (Signed)
Patient accidentally forgot her discharge paperwork, from 09/13 and would like it mailed to her . She also requested her lab results, when available  Pt contact [phone]

## 2016-04-04 ENCOUNTER — Other Ambulatory Visit (INDEPENDENT_AMBULATORY_CARE_PROVIDER_SITE_OTHER): Payer: PPO

## 2016-04-04 ENCOUNTER — Telehealth: Payer: Self-pay | Admitting: *Deleted

## 2016-04-04 DIAGNOSIS — R300 Dysuria: Secondary | ICD-10-CM | POA: Diagnosis not present

## 2016-04-04 DIAGNOSIS — R351 Nocturia: Secondary | ICD-10-CM | POA: Insufficient documentation

## 2016-04-04 LAB — POCT URINALYSIS DIPSTICK
Bilirubin, UA: NEGATIVE
Blood, UA: NEGATIVE
GLUCOSE UA: NEGATIVE
Ketones, UA: NEGATIVE
LEUKOCYTES UA: NEGATIVE
Nitrite, UA: NEGATIVE
Protein, UA: NEGATIVE
SPEC GRAV UA: 1.015
UROBILINOGEN UA: 0.2
pH, UA: 6.5

## 2016-04-04 NOTE — Assessment & Plan Note (Signed)
Plain films in the past noted  DJD of hip and fractures. . Referral to Dr Mack Guise for steroid ijection

## 2016-04-04 NOTE — Telephone Encounter (Signed)
Patient requested lab results Pt contact [phone]

## 2016-04-04 NOTE — Assessment & Plan Note (Signed)

## 2016-04-04 NOTE — Assessment & Plan Note (Signed)
She was unable to provide a urine specimen today and will return a sample

## 2016-04-04 NOTE — Telephone Encounter (Signed)
Patient has been told that labs have not been interpreted.

## 2016-04-08 ENCOUNTER — Telehealth: Payer: Self-pay | Admitting: Internal Medicine

## 2016-04-08 DIAGNOSIS — M25552 Pain in left hip: Secondary | ICD-10-CM

## 2016-04-08 NOTE — Telephone Encounter (Signed)
Pt was seen on Sept. 13 by Dr. Derrel Nip, they talked about getting shots in her hip. She would like to know if she needs a referral to have this done and who does she go to have this done. [phone].Pt is going out at 9:30 and will not be home until after 4 pm.

## 2016-04-08 NOTE — Telephone Encounter (Signed)
Patient has decided she would like to try the injections to her hip as discussed during visit.

## 2016-04-08 NOTE — Telephone Encounter (Signed)
Your referral  To orthopedics is in process as requested. Our referral coordinator will call you when the appointment has been made.

## 2016-04-16 DIAGNOSIS — M25552 Pain in left hip: Secondary | ICD-10-CM | POA: Diagnosis not present

## 2016-04-16 DIAGNOSIS — M461 Sacroiliitis, not elsewhere classified: Secondary | ICD-10-CM | POA: Diagnosis not present

## 2016-04-16 DIAGNOSIS — M7072 Other bursitis of hip, left hip: Secondary | ICD-10-CM | POA: Diagnosis not present

## 2016-04-17 ENCOUNTER — Encounter: Payer: Medicare Other | Admitting: Internal Medicine

## 2016-05-30 DIAGNOSIS — H353222 Exudative age-related macular degeneration, left eye, with inactive choroidal neovascularization: Secondary | ICD-10-CM | POA: Diagnosis not present

## 2016-07-11 DIAGNOSIS — H353222 Exudative age-related macular degeneration, left eye, with inactive choroidal neovascularization: Secondary | ICD-10-CM | POA: Diagnosis not present

## 2016-08-20 ENCOUNTER — Telehealth: Payer: Self-pay | Admitting: *Deleted

## 2016-08-20 NOTE — Telephone Encounter (Signed)
Pt called INS,to have INS questions answered

## 2016-08-29 DIAGNOSIS — H353222 Exudative age-related macular degeneration, left eye, with inactive choroidal neovascularization: Secondary | ICD-10-CM | POA: Diagnosis not present

## 2016-09-25 ENCOUNTER — Telehealth: Payer: Self-pay | Admitting: Internal Medicine

## 2016-09-25 NOTE — Telephone Encounter (Signed)
Pt called wanting to know the difference between Rx melatonin and over the counter melatonin?  Please advise?  Call pt @ [phone]. Please msg if no answer. Thank you!  If Rx is needed please call into pharmacy [facility], [place]

## 2016-09-25 NOTE — Telephone Encounter (Signed)
Left detailed mess informing pt no prescription is needed for melatonin and to ask her pharmacist for help in choosing which is best for her.

## 2016-10-10 DIAGNOSIS — H353222 Exudative age-related macular degeneration, left eye, with inactive choroidal neovascularization: Secondary | ICD-10-CM | POA: Diagnosis not present

## 2016-11-18 DIAGNOSIS — L821 Other seborrheic keratosis: Secondary | ICD-10-CM | POA: Diagnosis not present

## 2016-11-18 DIAGNOSIS — I8393 Asymptomatic varicose veins of bilateral lower extremities: Secondary | ICD-10-CM | POA: Diagnosis not present

## 2016-11-18 DIAGNOSIS — Z85828 Personal history of other malignant neoplasm of skin: Secondary | ICD-10-CM | POA: Diagnosis not present

## 2016-11-18 DIAGNOSIS — L578 Other skin changes due to chronic exposure to nonionizing radiation: Secondary | ICD-10-CM | POA: Diagnosis not present

## 2016-11-18 DIAGNOSIS — Z1283 Encounter for screening for malignant neoplasm of skin: Secondary | ICD-10-CM | POA: Diagnosis not present

## 2016-11-18 DIAGNOSIS — L820 Inflamed seborrheic keratosis: Secondary | ICD-10-CM | POA: Diagnosis not present

## 2016-11-18 DIAGNOSIS — D485 Neoplasm of uncertain behavior of skin: Secondary | ICD-10-CM | POA: Diagnosis not present

## 2016-11-21 DIAGNOSIS — H353222 Exudative age-related macular degeneration, left eye, with inactive choroidal neovascularization: Secondary | ICD-10-CM | POA: Diagnosis not present

## 2016-12-09 DIAGNOSIS — H40003 Preglaucoma, unspecified, bilateral: Secondary | ICD-10-CM | POA: Diagnosis not present

## 2017-03-05 ENCOUNTER — Telehealth: Payer: Self-pay | Admitting: *Deleted

## 2017-03-05 DIAGNOSIS — R5383 Other fatigue: Secondary | ICD-10-CM

## 2017-03-05 DIAGNOSIS — E785 Hyperlipidemia, unspecified: Secondary | ICD-10-CM

## 2017-03-05 DIAGNOSIS — E559 Vitamin D deficiency, unspecified: Secondary | ICD-10-CM

## 2017-03-05 NOTE — Telephone Encounter (Signed)
Pt requested to have her lab orders placed  prior to her CPE in Oct Pt contact [phone]

## 2017-03-06 DIAGNOSIS — H353222 Exudative age-related macular degeneration, left eye, with inactive choroidal neovascularization: Secondary | ICD-10-CM | POA: Diagnosis not present

## 2017-03-06 NOTE — Telephone Encounter (Signed)
Attempted to call pt no answer/ no voicemail. Need to schedule pt for a fasting lab appt. Labs have been ordered.   Ordered cbc, tsh, cmp, lipid, ldl and vitamin d.

## 2017-03-11 NOTE — Telephone Encounter (Signed)
LMTCB

## 2017-03-12 NOTE — Telephone Encounter (Signed)
Pt called back and scheduled her lab appt. Pt is aware of appt date and time.

## 2017-04-16 IMAGING — CR DG HIP (WITH OR WITHOUT PELVIS) 2-3V*L*
3 series · 3 of 3 positions shown · non-contrast
Comparison: None.

CLINICAL DATA: Pain.  Initial evaluation.

EXAM:
DG HIP (WITH OR WITHOUT PELVIS) 2-3V LEFT

[pelvis ap]
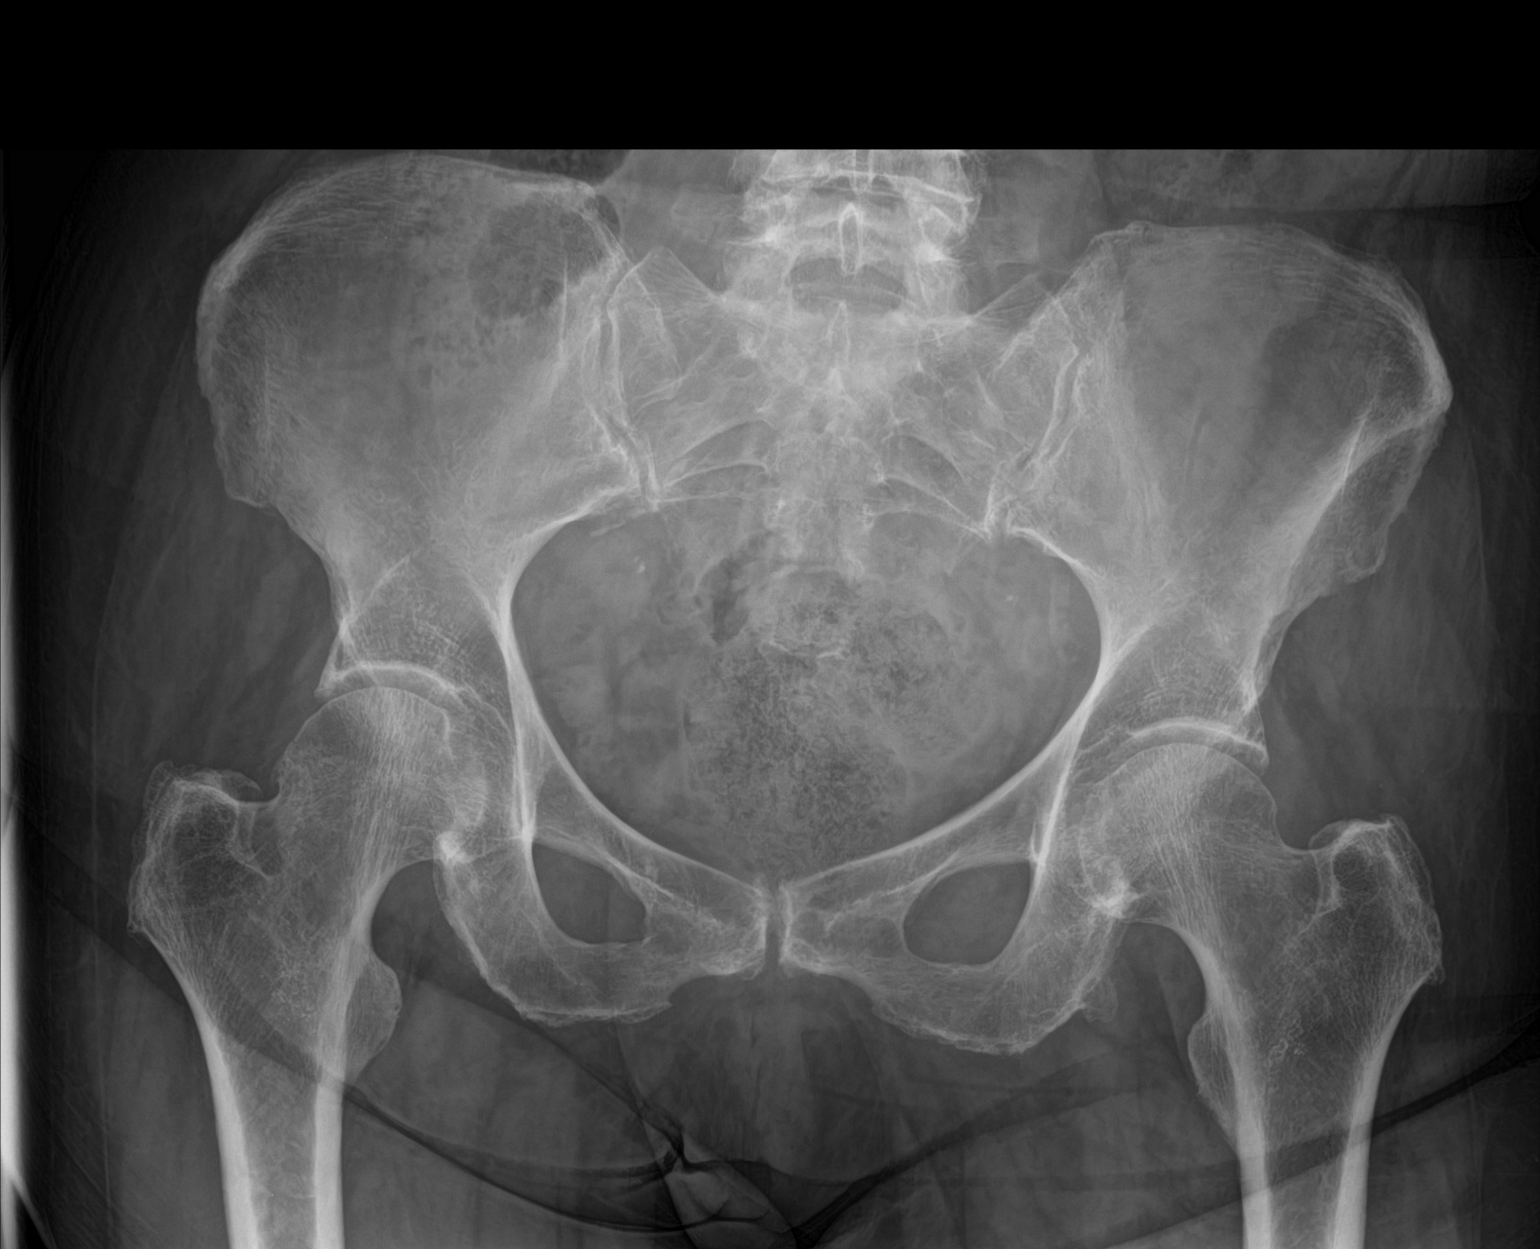

[hip ap]
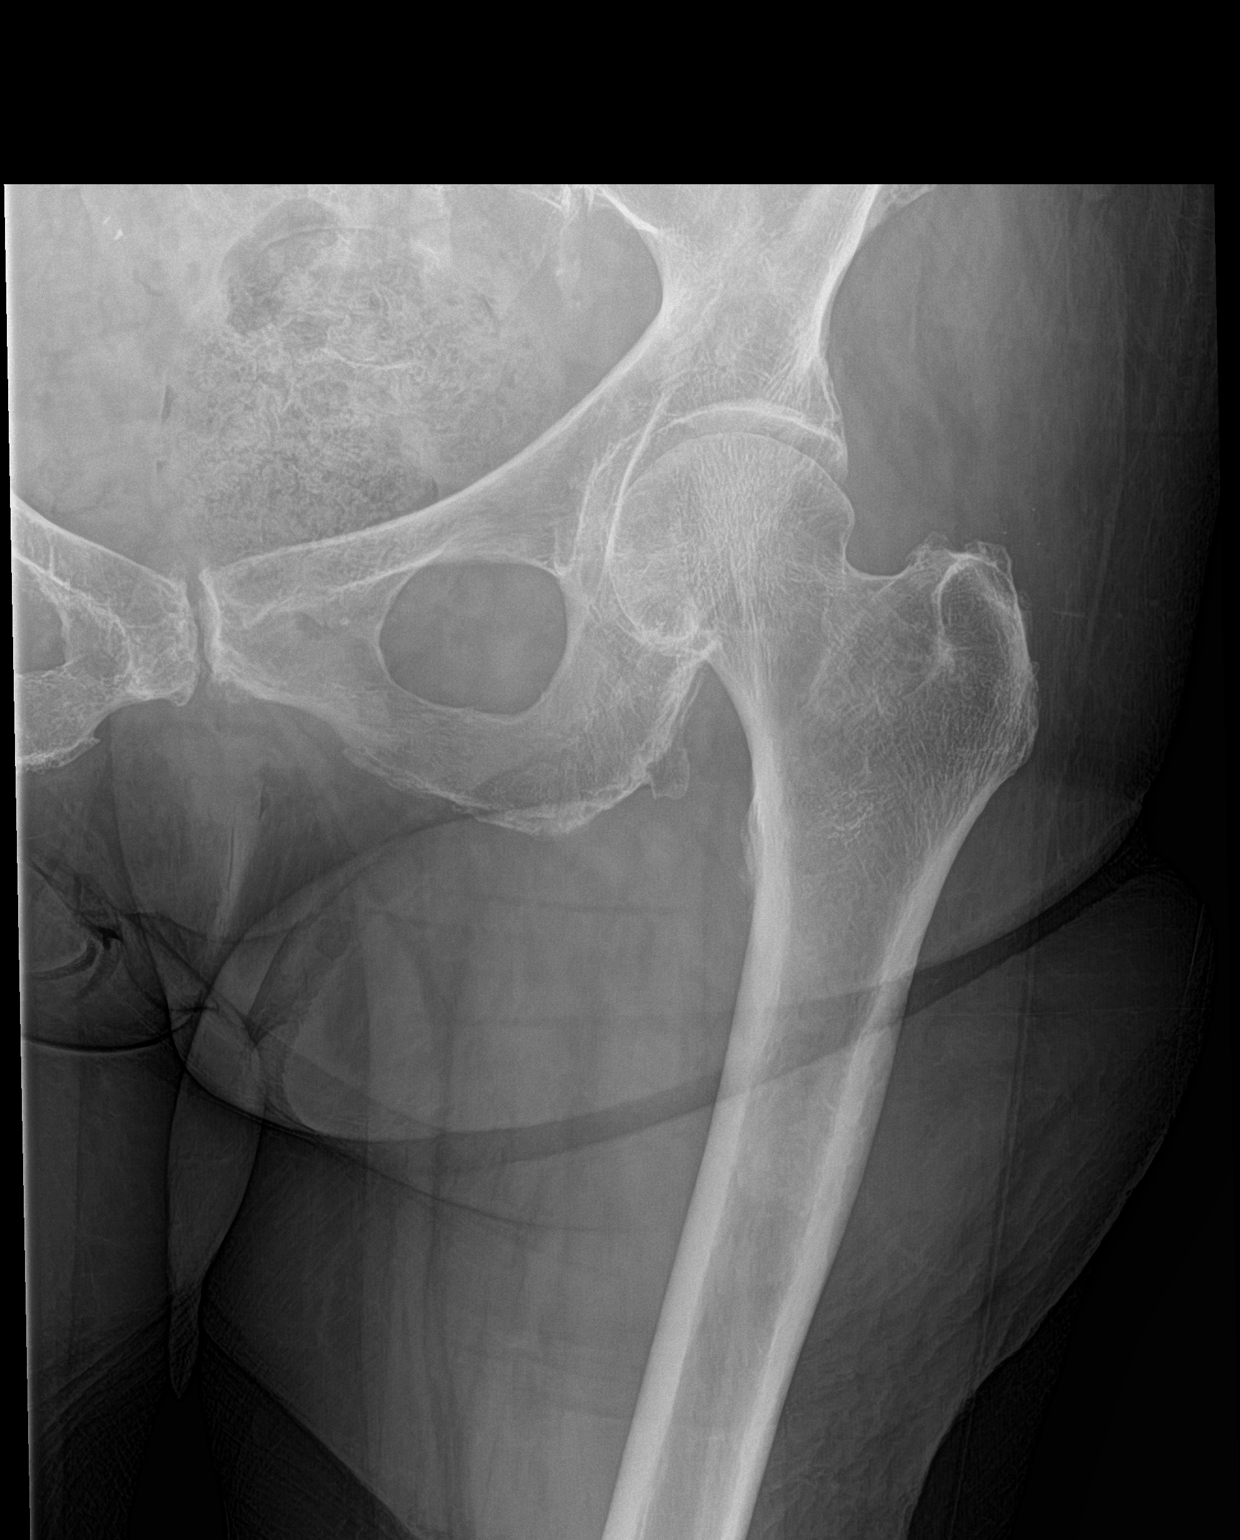

[hip lat]
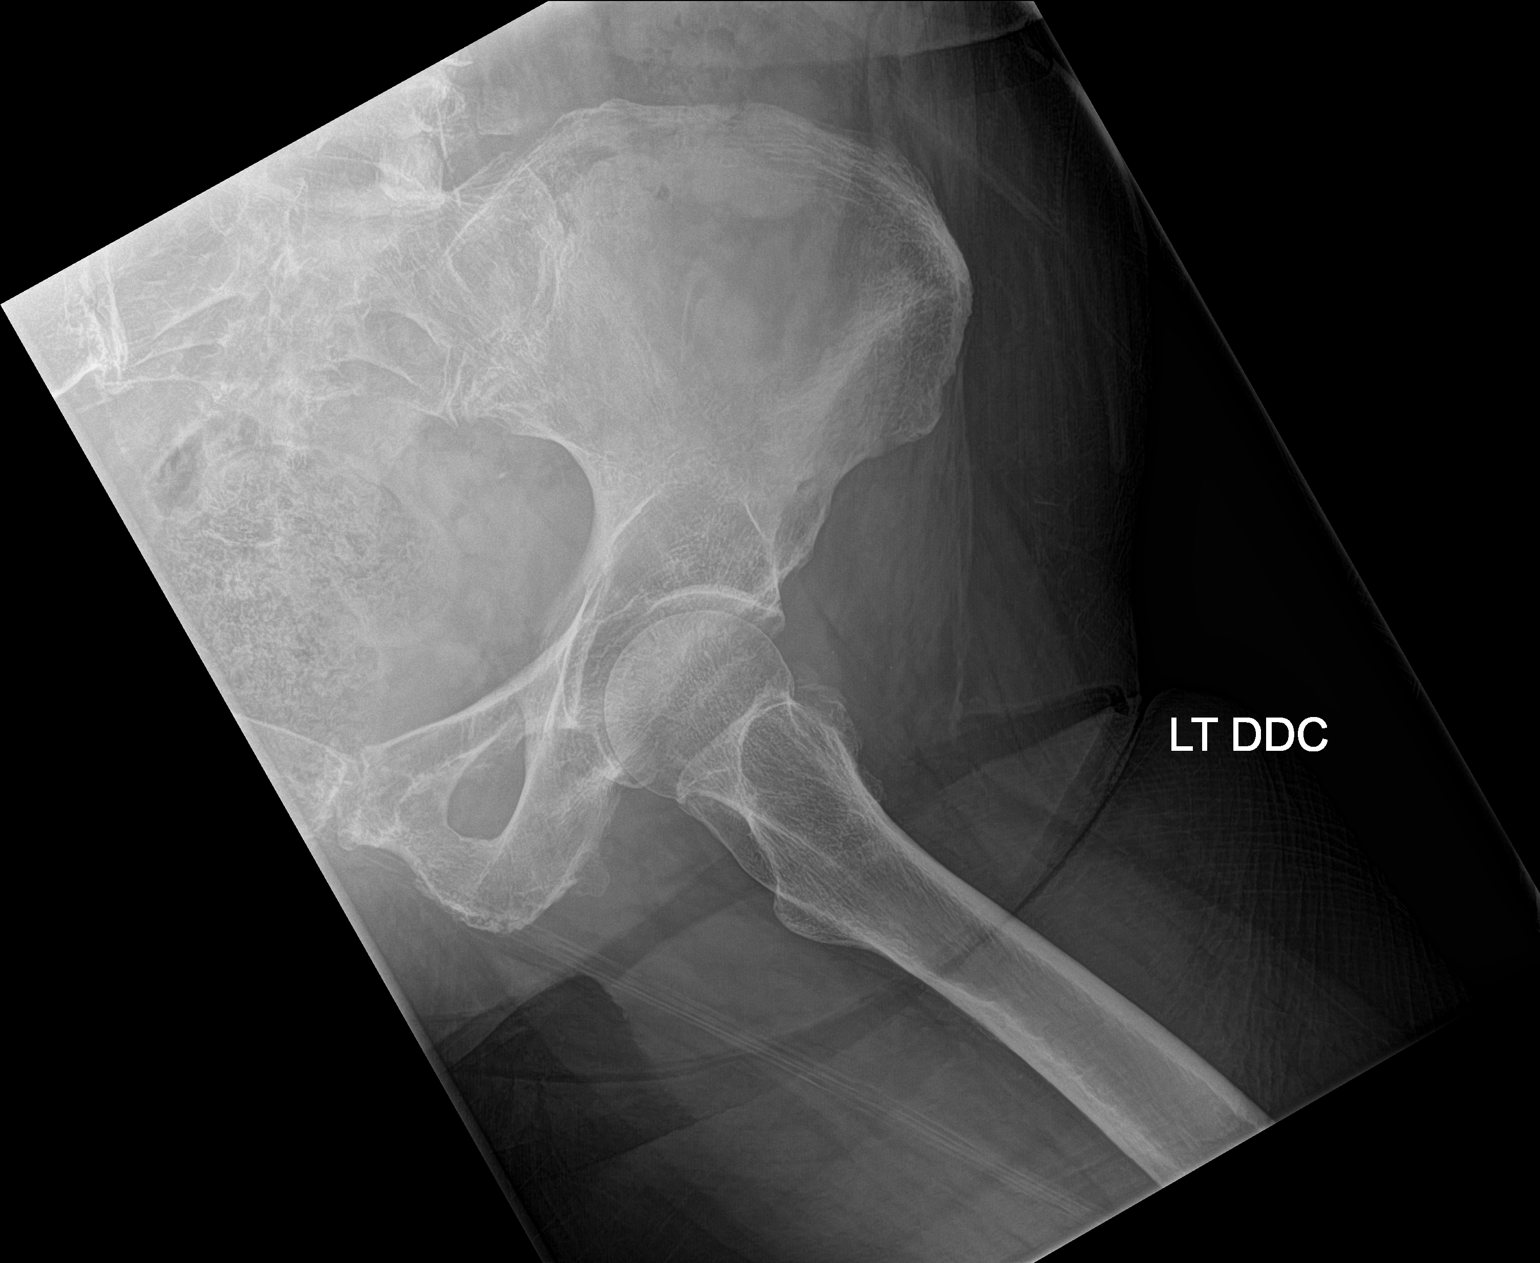

[3 of 3 positions shown; findings below may reference images not displayed]

FINDINGS: Degenerative changes lumbar spine and both hips. No acute bony or
joint abnormality identified.
IMPRESSION: Diffuse osteopenia.  No acute abnormality.

## 2017-04-20 DIAGNOSIS — H353222 Exudative age-related macular degeneration, left eye, with inactive choroidal neovascularization: Secondary | ICD-10-CM | POA: Diagnosis not present

## 2017-05-01 ENCOUNTER — Other Ambulatory Visit (INDEPENDENT_AMBULATORY_CARE_PROVIDER_SITE_OTHER): Payer: PPO

## 2017-05-01 ENCOUNTER — Ambulatory Visit: Payer: PPO

## 2017-05-01 ENCOUNTER — Other Ambulatory Visit: Payer: PPO

## 2017-05-01 DIAGNOSIS — R5383 Other fatigue: Secondary | ICD-10-CM

## 2017-05-01 DIAGNOSIS — E785 Hyperlipidemia, unspecified: Secondary | ICD-10-CM

## 2017-05-01 DIAGNOSIS — E559 Vitamin D deficiency, unspecified: Secondary | ICD-10-CM | POA: Diagnosis not present

## 2017-05-01 LAB — COMPREHENSIVE METABOLIC PANEL
ALBUMIN: 3.9 g/dL (ref 3.5–5.2)
ALT: 15 U/L (ref 0–35)
AST: 19 U/L (ref 0–37)
Alkaline Phosphatase: 49 U/L (ref 39–117)
BILIRUBIN TOTAL: 0.8 mg/dL (ref 0.2–1.2)
BUN: 22 mg/dL (ref 6–23)
CALCIUM: 8.7 mg/dL (ref 8.4–10.5)
CHLORIDE: 107 meq/L (ref 96–112)
CO2: 24 meq/L (ref 19–32)
CREATININE: 0.88 mg/dL (ref 0.40–1.20)
GFR: 64.57 mL/min (ref >60.00)
Glucose, Bld: 84 mg/dL (ref 70–99)
Potassium: 4 mEq/L (ref 3.5–5.1)
Sodium: 140 mEq/L (ref 135–145)
Total Protein: 6.5 g/dL (ref 6.0–8.3)

## 2017-05-01 LAB — CBC WITH DIFFERENTIAL/PLATELET
BASOS PCT: 0.9 % (ref 0.0–3.0)
Basophils Absolute: 0 10*3/uL (ref 0.0–0.1)
EOS ABS: 0.2 10*3/uL (ref 0.0–0.7)
EOS PCT: 4.3 % (ref 0.0–5.0)
HEMATOCRIT: 40.1 % (ref 36.0–46.0)
HEMOGLOBIN: 13.2 g/dL (ref 12.0–15.0)
Lymphocytes Relative: 25.2 % (ref 12.0–46.0)
Lymphs Abs: 1.2 10*3/uL (ref 0.7–4.0)
MCHC: 32.9 g/dL (ref 30.0–36.0)
MCV: 91.5 fl (ref 78.0–100.0)
MONO ABS: 0.6 10*3/uL (ref 0.1–1.0)
Monocytes Relative: 12.1 % — ABNORMAL HIGH (ref 3.0–12.0)
NEUTROS ABS: 2.8 10*3/uL (ref 1.4–7.7)
Neutrophils Relative %: 57.5 % (ref 43.0–77.0)
PLATELETS: 210 10*3/uL (ref 150.0–400.0)
RBC: 4.38 Mil/uL (ref 3.87–5.11)
RDW: 13.9 % (ref 11.5–15.5)
WBC: 4.9 10*3/uL (ref 4.0–10.5)

## 2017-05-01 LAB — LIPID PANEL
CHOL/HDL RATIO: 4
Cholesterol: 251 mg/dL — ABNORMAL HIGH (ref 0–200)
HDL: 68.2 mg/dL (ref >39.00)
LDL Cholesterol: 166 mg/dL — ABNORMAL HIGH (ref 0–99)
NONHDL: 182.4
TRIGLYCERIDES: 83 mg/dL (ref 0.0–149.0)
VLDL: 16.6 mg/dL (ref 0.0–40.0)

## 2017-05-01 LAB — LDL CHOLESTEROL, DIRECT: Direct LDL: 159 mg/dL

## 2017-05-01 LAB — TSH: TSH: 0.87 u[IU]/mL (ref 0.35–4.50)

## 2017-05-01 LAB — VITAMIN D 25 HYDROXY (VIT D DEFICIENCY, FRACTURES): VITD: 36.12 ng/mL (ref 30.00–100.00)

## 2017-05-04 ENCOUNTER — Ambulatory Visit (INDEPENDENT_AMBULATORY_CARE_PROVIDER_SITE_OTHER): Payer: PPO

## 2017-05-04 ENCOUNTER — Ambulatory Visit (INDEPENDENT_AMBULATORY_CARE_PROVIDER_SITE_OTHER): Payer: PPO | Admitting: Internal Medicine

## 2017-05-04 ENCOUNTER — Encounter: Payer: Self-pay | Admitting: Internal Medicine

## 2017-05-04 VITALS — BP 132/70 | HR 75 | Temp 98.0°F | Resp 15 | Ht 59.75 in | Wt 129.2 lb

## 2017-05-04 VITALS — BP 132/70 | HR 75 | Temp 98.0°F | Resp 15 | Ht 59.75 in | Wt 129.0 lb

## 2017-05-04 DIAGNOSIS — H3530 Unspecified macular degeneration: Secondary | ICD-10-CM

## 2017-05-04 DIAGNOSIS — Z0000 Encounter for general adult medical examination without abnormal findings: Secondary | ICD-10-CM | POA: Diagnosis not present

## 2017-05-04 DIAGNOSIS — Z780 Asymptomatic menopausal state: Secondary | ICD-10-CM

## 2017-05-04 DIAGNOSIS — H5316 Psychophysical visual disturbances: Secondary | ICD-10-CM | POA: Insufficient documentation

## 2017-05-04 DIAGNOSIS — G4701 Insomnia due to medical condition: Secondary | ICD-10-CM | POA: Diagnosis not present

## 2017-05-04 MED ORDER — TEMAZEPAM 7.5 MG PO CAPS: 7.5000 mg | ORAL_CAPSULE | Freq: Every evening | ORAL | 0 refills | Status: DC | PRN

## 2017-05-04 MED ORDER — ZOSTER VAC RECOMB ADJUVANTED 50 MCG/0.5ML IM SUSR: 0.5000 mL | Freq: Once | INTRAMUSCULAR | 1 refills | Status: DC

## 2017-05-04 MED ORDER — ZOSTER VAC RECOMB ADJUVANTED 50 MCG/0.5ML IM SUSR: 0.5000 mL | Freq: Once | INTRAMUSCULAR | 1 refills | Status: AC

## 2017-05-04 NOTE — Assessment & Plan Note (Signed)
Diagnosed in the setting of macular degeneration with new onset visual hallucinations.  She declined medications for anxiety at this time , but accepted an alternative sleep aid

## 2017-05-04 NOTE — Assessment & Plan Note (Signed)
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

## 2017-05-04 NOTE — Progress Notes (Signed)
Patient ID: Jo Burke, female    DOB: [birth year]  Age: 83 y.o. MRN: [id number]  The patient is here for follow up on chronic and acute problems.  Was  treated for bursitis of  left hip by KK  Last visit sept 2017 no current symptoms Last mammogram Oct 2016. Declines any further screening Labs reviewed   Walking at [facility] , goes to exercise classes  (balance and water aerobics) No Falls    The risk factors are reflected in the social history.  The roster of all physicians providing medical care to patient - is listed in the Snapshot section of the chart.  Activities of daily living:  The patient is 100% independent in all ADLs: dressing, toileting, feeding as well as independent mobility  Home safety : The patient has smoke detectors in the home. They wear seatbelts.  There are no firearms at home. There is no violence in the home.   There is no risks for hepatitis, STDs or HIV. There is no   history of blood transfusion. They have no travel history to infectious disease endemic areas of the world.  The patient has seen their dentist in the last six month. They have seen their eye doctor in the last year. They admit to slight hearing difficulty with regard to whispered voices and some television programs.  They have deferred audiologic testing in the last year.  They do not  have excessive sun exposure. Discussed the need for sun protection: hats, long sleeves and use of sunscreen if there is significant sun exposure.   Diet: the importance of a healthy diet is discussed. They do have a healthy diet.  The benefits of regular aerobic exercise were discussed. She walks 4 times per week ,  20 minutes.   Depression screen: there are no signs or vegative symptoms of depression- irritability, change in appetite, anhedonia, sadness/tearfullness.  Cognitive assessment: the patient manages all their financial and personal affairs and is actively engaged. They could relate day,date,year and  events; recalled 2/3 objects at 3 minutes; performed clock-face test normally.  The following portions of the patient's history were reviewed and updated as appropriate: allergies, current medications, past family history, past medical history,  past surgical history, past social history  and problem list.  Visual acuity was not assessed per patient preference since she has regular follow up with her ophthalmologist. Hearing and body mass index were assessed and reviewed.   During the course of the visit the patient was educated and counseled about appropriate screening and preventive services including : fall prevention , diabetes screening, nutrition counseling, colorectal cancer screening, and recommended immunizations.    CC: The primary encounter diagnosis was Postmenopausal estrogen deficiency. Diagnoses of Sherran Needs syndrome, Routine general medical examination at a health care facility, Macular degeneration of left eye, unspecified type, and Insomnia due to medical condition were also pertinent to this visit.  Not walking much due to macular generation and loss of balance .  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  Does not drive due to macular degeneration  Insomnia: chronic,  Used to take alprazolam .  Only 2 hour of sleep  Has declined the flu vaccine due to prior reaction that lasted a month     History [name] has a past medical history of Macular degeneration, left eye (1992).   She has a past surgical history that includes vocal cord polyp.   Her family history includes COPD in her father; Cancer in her father; Cancer (age of onset: 64) in her brother; Cancer (age of onset: 51) in her mother.She  reports that she has quit smoking. She has never used smokeless tobacco. She reports that she drinks about 3.0 oz of alcohol per week . She reports that she does not use drugs.  Outpatient Medications Prior to Visit  Medication Sig Dispense Refill  . ALPRAZolam (XANAX) 0.25 MG tablet 1 to 2 tablets at bedtime as needed for insomnia 60 tablet 2  . Cholecalciferol (VITAMIN D3) 1000 UNITS CAPS Take 1 capsule by mouth.    . Multiple Vitamins-Minerals (PRESERVISION AREDS) CAPS Take 1 capsule by mouth 2 (two) times daily.    . Omega-3 Fatty Acids (FISH OIL) 1000 MG CAPS Take 1 capsule by mouth 2 (two) times daily.    Marland Kitchen Raspberry Ketones 100 MG CAPS Take 1 capsule by mouth daily.    . traMADol (ULTRAM) 50 MG tablet Take 1 tablet (50 mg total) by mouth every 8 (eight) hours as needed. (Patient not taking: Reported on 04/02/2016) 90 tablet 0   No facility-administered medications prior to visit.     Review of Systems   Patient denies headache, fevers, malaise, unintentional weight loss, skin rash, eye pain, sinus congestion and sinus pain, sore throat, dysphagia,  hemoptysis , cough, dyspnea, wheezing, chest pain, palpitations, orthopnea, edema, abdominal pain, nausea, melena, diarrhea, constipation, flank pain, dysuria, hematuria, urinary  Frequency, nocturia, numbness, tingling, seizures,  Focal weakness, Loss of consciousness,  Tremor, insomnia, depression, anxiety, and suicidal ideation.      Objective:  BP 132/70 (BP Location: Left Arm, Patient Position: Sitting, Cuff Size: Normal)   Pulse 75   Temp 98 F (36.7 C) (Oral)   Resp 15   Ht 4' 11.75" (1.518 m)   Wt 129 lb 3.2 oz (58.6 kg)   SpO2 97%   BMI 25.44 kg/m   Physical Exam  General appearance: alert, cooperative and appears stated age Ears: normal TM's and external ear canals both ears Throat: lips, mucosa, and tongue normal; teeth and gums normal Neck: no adenopathy, no carotid bruit, supple, symmetrical, trachea midline and  thyroid not enlarged, symmetric, no tenderness/mass/nodules Back: symmetric, no curvature. ROM normal. No CVA tenderness. Lungs: clear to auscultation bilaterally Heart: regular rate and rhythm, S1, S2 normal, no murmur, click, rub or gallop Abdomen: soft, non-tender; bowel sounds normal; no masses,  no organomegaly Pulses: 2+ and symmetric Skin: Skin color, texture, turgor normal. No rashes or lesions Lymph nodes: Cervical, supraclavicular, and axillary nodes normal.   Assessment & Plan:   Problem List Items Addressed This Visit    Sherran Needs syndrome    Diagnosed in the setting of macular degeneration with new onset visual hallucinations.  She declined medications for anxiety at this time , but accepted an alternative sleep aid       Insomnia due to medical condition    No improvement with alprazolam.  Trial of restoril 7.5 mg       Macular degeneration, left eye    Complicated by previous vision loss in right eye and new onset Bonnet syndrome       Routine general medical examination at a health  care facility    Annual comprehensive preventive exam was done as well as an evaluation and management of chronic conditions .  During the course of the visit the patient was educated and counseled about appropriate screening and preventive services including :  diabetes screening, lipid analysis with projected  10 year  risk for CAD , nutrition counseling, breast, cervical and colorectal cancer screening, and recommended immunizations.  Printed recommendations for health maintenance scnreenings was give       Other Visit Diagnoses    Postmenopausal estrogen deficiency    -  Primary   Relevant Orders   DG Bone Density      I have discontinued Ms. Mcafee's traMADol. I am also having her maintain her PRESERVISION AREDS, Fish Oil, Raspberry Ketones, Vitamin D3, ALPRAZolam, temazepam, and Zoster Vaccine Adjuvanted.  Meds ordered this encounter  Medications  . DISCONTD:  temazepam (RESTORIL) 7.5 MG capsule    Sig: Take 1 capsule (7.5 mg total) by mouth at bedtime as needed for sleep.    Dispense:  30 capsule    Refill:  0  . DISCONTD: Zoster Vaccine Adjuvanted (SHINGRIX) injection    Sig: Inject 0.5 mLs into the muscle once.    Dispense:  1 each    Refill:  1  . DISCONTD: temazepam (RESTORIL) 7.5 MG capsule    Sig: Take 1 capsule (7.5 mg total) by mouth at bedtime as needed for sleep.    Dispense:  30 capsule    Refill:  0  . DISCONTD: Zoster Vaccine Adjuvanted (SHINGRIX) injection    Sig: Inject 0.5 mLs into the muscle once.    Dispense:  1 each    Refill:  1  . temazepam (RESTORIL) 7.5 MG capsule    Sig: Take 1 capsule (7.5 mg total) by mouth at bedtime as needed for sleep.    Dispense:  30 capsule    Refill:  0  . Zoster Vaccine Adjuvanted [facility]) injection    Sig: Inject 0.5 mLs into the muscle once.    Dispense:  1 each    Refill:  1    Medications Discontinued During This Encounter  Medication Reason  . traMADol (ULTRAM) 50 MG tablet Patient has not taken in last 30 days  . temazepam (RESTORIL) 7.5 MG capsule Reorder  . Zoster Vaccine Adjuvanted [facility]) injection Reorder  . temazepam (RESTORIL) 7.5 MG capsule Reorder  . Zoster Vaccine Adjuvanted [facility]) injection Reorder    Follow-up: No Follow-up on file.   Crecencio Mc, MD

## 2017-05-04 NOTE — Assessment & Plan Note (Signed)
Annual comprehensive preventive exam was done as well as an evaluation and management of chronic conditions .  During the course of the visit the patient was educated and counseled about appropriate screening and preventive services including :  diabetes screening, lipid analysis with projected  10 year  risk for CAD , nutrition counseling, breast, cervical and colorectal cancer screening, and recommended immunizations.  Printed recommendations for health maintenance scnreenings was give

## 2017-05-04 NOTE — Progress Notes (Signed)
Subjective:   Jo Burke is a 83 y.o. female who presents for Medicare Annual (Subsequent) preventive examination.  Review of Systems:  No ROS.  Medicare Wellness Visit. Additional risk factors are reflected in the social history.  Cardiac Risk Factors include: advanced age (>36men, >56 women)     Objective:     Vitals: BP 132/70 (BP Location: Left Arm, Patient Position: Sitting, Cuff Size: Normal)   Pulse 75   Temp 98 F (36.7 C) (Oral)   Resp 15   Ht 4' 11.75" (1.518 m)   Wt 129 lb (58.5 kg)   SpO2 97%   BMI 25.40 kg/m   Body mass index is 25.4 kg/m.   Tobacco History  Smoking Status  . Former Smoker  Smokeless Tobacco  . Never Used     Counseling given: Not Answered   Past Medical History:  Diagnosis Date  . Macular degeneration, left eye 1992   app[place]eller,    Past Surgical History:  Procedure Laterality Date  . vocal cord polyp     1962   Family History  Problem Relation Age of Onset  . COPD Father   . Cancer Father        lung  . Cancer Brother 4       liver,   . Cancer Mother 24       Breast   History  Sexual Activity  . Sexual activity: Not Currently    Outpatient Encounter Prescriptions as of 05/04/2017  Medication Sig  . ALPRAZolam (XANAX) 0.25 MG tablet 1 to 2 tablets at bedtime as needed for insomnia  . Cholecalciferol (VITAMIN D3) 1000 UNITS CAPS Take 1 capsule by mouth.  . Multiple Vitamins-Minerals (PRESERVISION AREDS) CAPS Take 1 capsule by mouth 2 (two) times daily.  . Omega-3 Fatty Acids (FISH OIL) 1000 MG CAPS Take 1 capsule by mouth 2 (two) times daily.  Marland Kitchen Raspberry Ketones 100 MG CAPS Take 1 capsule by mouth daily.  . temazepam (RESTORIL) 7.5 MG capsule Take 1 capsule (7.5 mg total) by mouth at bedtime as needed for sleep.  Marland Kitchen Zoster Vaccine Adjuvanted [facility]) injection Inject 0.5 mLs into the muscle once.   No facility-administered encounter medications on file as of 05/04/2017.     Activities of Daily  Living In your present state of health, do you have any difficulty performing the following activities: 05/04/2017  Hearing? N  Vision? Y  Comment Macular degeneration, L eye  Difficulty concentrating or making decisions? N  Walking or climbing stairs? N  Dressing or bathing? N  Doing errands, shopping? Y  Comment She does not Physiological scientist and eating ? N  Using the Toilet? N  In the past six months, have you accidently leaked urine? Y  Comment Wears a brief at night only  Do you have problems with loss of bowel control? N  Managing your Medications? N  Managing your Finances? N  Housekeeping or managing your Housekeeping? N  Some recent data might be hidden    Patient Care Team: Crecencio Mc, MD as PCP - General (Internal Medicine)    Assessment:    This is a routine wellness examination for Jo Burke. The goal of the wellness visit is to assist the patient how to close the gaps in care and create a preventative care plan for the patient.   The roster of all physicians providing medical care to patient is listed in the Snapshot section of the chart.  Taking calcium VIT D as  appropriate/Osteoporosis risk reviewed.    Safety issues reviewed; Life alert, smoke and carbon monoxide detectors in the home. No firearms in the home.  Wears seatbelts when riding with others. Patient does wear sunscreen or protective clothing when in direct sunlight. No violence in the home.  Patient is alert, normal appearance, oriented to person/place/and time.  Correctly identified the president of the [place], recall of 2/3 words, and performing simple calculations. Displays appropriate judgement and can read correct time from watch face.   No new identified risk were noted.  No failures at ADL's or IADL's.    BMI- discussed the importance of a healthy diet, water intake and the benefits of aerobic exercise. Educational material provided.   24 hour diet recall: Low carb diet  Daily fluid  intake: 1 cups of caffeine, 4 cups of water  Dental- every 6 months.  Eye- Visual acuity not assessed per patient preference since they have regular follow up with the ophthalmologist.  Wears corrective lenses.  Sleep patterns- Sleeps 3 hours at night.  Followed by PCP.  Patient Concerns: None at this time. Follow up with PCP as needed.  Exercise Activities and Dietary recommendations Current Exercise Habits: Home exercise routine, Type of exercise: stretching;walking;strength training/weights (H20 aerobics), Time (Minutes): 45, Frequency (Times/Week): 3, Weekly Exercise (Minutes/Week): 135, Intensity: Mild  Goals    . Increase water intake      Fall Risk Fall Risk  05/04/2017 05/04/2017 03/18/2016 10/23/2014 12/21/2013  Falls in the past year? No No No No No  Risk for fall due to : - - - Impaired vision -   Depression Screen PHQ 2/9 Scores 05/04/2017 05/04/2017 03/18/2016 10/23/2014  PHQ - 2 Score 0 0 0 0  PHQ- 9 Score 3 3 - -     Cognitive Function     6CIT Screen 05/04/2017  What Year? 0 points  What month? 0 points  What time? 0 points  Count back from 20 0 points  Months in reverse 0 points  Repeat phrase 0 points  Total Score 0    Immunization History  Administered Date(s) Administered  . Influenza Split 06/07/2012  . Pneumococcal Conjugate-13 12/21/2013  . Pneumococcal Polysaccharide-23 12/21/2009, 06/13/2015  . Tdap 10/23/2007  . Zoster 12/22/2007   Screening Tests Health Maintenance  Topic Date Due  . DEXA SCAN  02/03/1995  . MAMMOGRAM  05/01/2017  . INFLUENZA VACCINE  10/18/2017 (Originally 02/18/2017)  . TETANUS/TDAP  10/22/2017  . PNA vac Low Risk Adult  Completed      Plan:    End of life planning; Advance aging; Advanced directives discussed. Copy of current HCPOA/Living Will on file.    I have personally reviewed and noted the following in the patient's chart:   . Medical and social history . Use of alcohol, tobacco or illicit drugs  . Current  medications and supplements . Functional ability and status . Nutritional status . Physical activity . Advanced directives . List of other physicians . Hospitalizations, surgeries, and ER visits in previous 12 months . Vitals . Screenings to include cognitive, depression, and falls . Referrals and appointments  In addition, I have reviewed and discussed with patient certain preventive protocols, quality metrics, and best practice recommendations. A written personalized care plan for preventive services as well as general preventive health recommendations were provided to patient.     OBrien-Blaney, Ezekeil Bethel L, LPN  31/54/0086    I have reviewed the above information and agree with above.   Deborra Medina, MD

## 2017-05-04 NOTE — Assessment & Plan Note (Signed)
No improvement with alprazolam.  Trial of restoril 7.5 mg

## 2017-05-04 NOTE — Patient Instructions (Addendum)
I have ordered your  Bone density test     Trial of generic Restoril for your insomnia.  Lowest dose.    The ShingRx vaccine is now available in local pharmacies and is much more protective thant Zostavaxs,  It is therefore ADVISED for all interested adults over 80 to prevent shingles    Health Maintenance for Postmenopausal Women Menopause is a normal process in which your reproductive ability comes to an end. This process happens gradually over a span of months to years, usually between the ages of 58 and 20. Menopause is complete when you have missed 12 consecutive menstrual periods. It is important to talk with your health care provider about some of the most common conditions that affect postmenopausal women, such as heart disease, cancer, and bone loss (osteoporosis). Adopting a healthy lifestyle and getting preventive care can help to promote your health and wellness. Those actions can also lower your chances of developing some of these common conditions. What should I know about menopause? During menopause, you may experience a number of symptoms, such as:  Moderate-to-severe hot flashes.  Night sweats.  Decrease in sex drive.  Mood swings.  Headaches.  Tiredness.  Irritability.  Memory problems.  Insomnia.  Choosing to treat or not to treat menopausal changes is an individual decision that you make with your health care provider. What should I know about hormone replacement therapy and supplements? Hormone therapy products are effective for treating symptoms that are associated with menopause, such as hot flashes and night sweats. Hormone replacement carries certain risks, especially as you become older. If you are thinking about using estrogen or estrogen with progestin treatments, discuss the benefits and risks with your health care provider. What should I know about heart disease and stroke? Heart disease, heart attack, and stroke become more likely as you age. This may  be due, in part, to the hormonal changes that your body experiences during menopause. These can affect how your body processes dietary fats, triglycerides, and cholesterol. Heart attack and stroke are both medical emergencies. There are many things that you can do to help prevent heart disease and stroke:  Have your blood pressure checked at least every 1-2 years. High blood pressure causes heart disease and increases the risk of stroke.  If you are 17-41 years old, ask your health care provider if you should take aspirin to prevent a heart attack or a stroke.  Do not use any tobacco products, including cigarettes, chewing tobacco, or electronic cigarettes. If you need help quitting, ask your health care provider.  It is important to eat a healthy diet and maintain a healthy weight. ? Be sure to include plenty of vegetables, fruits, low-fat dairy products, and lean protein. ? Avoid eating foods that are high in solid fats, added sugars, or salt (sodium).  Get regular exercise. This is one of the most important things that you can do for your health. ? Try to exercise for at least 150 minutes each week. The type of exercise that you do should increase your heart rate and make you sweat. This is known as moderate-intensity exercise. ? Try to do strengthening exercises at least twice each week. Do these in addition to the moderate-intensity exercise.  Know your numbers.Ask your health care provider to check your cholesterol and your blood glucose. Continue to have your blood tested as directed by your health care provider.  What should I know about cancer screening? There are several types of cancer. Take the following  steps to reduce your risk and to catch any cancer development as early as possible. Breast Cancer  Practice breast self-awareness. ? This means understanding how your breasts normally appear and feel. ? It also means doing regular breast self-exams. Let your health care provider  know about any changes, no matter how small.  If you are 60 or older, have a clinician do a breast exam (clinical breast exam or CBE) every year. Depending on your age, family history, and medical history, it may be recommended that you also have a yearly breast X-ray (mammogram).  If you have a family history of breast cancer, talk with your health care provider about genetic screening.  If you are at high risk for breast cancer, talk with your health care provider about having an MRI and a mammogram every year.  Breast cancer (BRCA) gene test is recommended for women who have family members with BRCA-related cancers. Results of the assessment will determine the need for genetic counseling and BRCA1 and for BRCA2 testing. BRCA-related cancers include these types: ? Breast. This occurs in males or females. ? Ovarian. ? Tubal. This may also be called fallopian tube cancer. ? Cancer of the abdominal or pelvic lining (peritoneal cancer). ? Prostate. ? Pancreatic.  Cervical, Uterine, and Ovarian Cancer Your health care provider may recommend that you be screened regularly for cancer of the pelvic organs. These include your ovaries, uterus, and vagina. This screening involves a pelvic exam, which includes checking for microscopic changes to the surface of your cervix (Pap test).  For women ages 21-65, health care providers may recommend a pelvic exam and a Pap test every three years. For women ages 33-65, they may recommend the Pap test and pelvic exam, combined with testing for human papilloma virus (HPV), every five years. Some types of HPV increase your risk of cervical cancer. Testing for HPV may also be done on women of any age who have unclear Pap test results.  Other health care providers may not recommend any screening for nonpregnant women who are considered low risk for pelvic cancer and have no symptoms. Ask your health care provider if a screening pelvic exam is right for you.  If you  have had past treatment for cervical cancer or a condition that could lead to cancer, you need Pap tests and screening for cancer for at least 20 years after your treatment. If Pap tests have been discontinued for you, your risk factors (such as having a new sexual partner) need to be reassessed to determine if you should start having screenings again. Some women have medical problems that increase the chance of getting cervical cancer. In these cases, your health care provider may recommend that you have screening and Pap tests more often.  If you have a family history of uterine cancer or ovarian cancer, talk with your health care provider about genetic screening.  If you have vaginal bleeding after reaching menopause, tell your health care provider.  There are currently no reliable tests available to screen for ovarian cancer.  Lung Cancer Lung cancer screening is recommended for adults 2-58 years old who are at high risk for lung cancer because of a history of smoking. A yearly low-dose CT scan of the lungs is recommended if you:  Currently smoke.  Have a history of at least 30 pack-years of smoking and you currently smoke or have quit within the past 15 years. A pack-year is smoking an average of one pack of cigarettes per day for  one year.  Yearly screening should:  Continue until it has been 15 years since you quit.  Stop if you develop a health problem that would prevent you from having lung cancer treatment.  Colorectal Cancer  This type of cancer can be detected and can often be prevented.  Routine colorectal cancer screening usually begins at age 61 and continues through age 52.  If you have risk factors for colon cancer, your health care provider may recommend that you be screened at an earlier age.  If you have a family history of colorectal cancer, talk with your health care provider about genetic screening.  Your health care provider may also recommend using home test  kits to check for hidden blood in your stool.  A small camera at the end of a tube can be used to examine your colon directly (sigmoidoscopy or colonoscopy). This is done to check for the earliest forms of colorectal cancer.  Direct examination of the colon should be repeated every 5-10 years until age 51. However, if early forms of precancerous polyps or small growths are found or if you have a family history or genetic risk for colorectal cancer, you may need to be screened more often.  Skin Cancer  Check your skin from head to toe regularly.  Monitor any moles. Be sure to tell your health care provider: ? About any new moles or changes in moles, especially if there is a change in a mole's shape or color. ? If you have a mole that is larger than the size of a pencil eraser.  If any of your family members has a history of skin cancer, especially at a young age, talk with your health care provider about genetic screening.  Always use sunscreen. Apply sunscreen liberally and repeatedly throughout the day.  Whenever you are outside, protect yourself by wearing long sleeves, pants, a wide-brimmed hat, and sunglasses.  What should I know about osteoporosis? Osteoporosis is a condition in which bone destruction happens more quickly than new bone creation. After menopause, you may be at an increased risk for osteoporosis. To help prevent osteoporosis or the bone fractures that can happen because of osteoporosis, the following is recommended:  If you are 84-10 years old, get at least 1,000 mg of calcium and at least 600 mg of vitamin D per day.  If you are older than age 67 but younger than age 91, get at least 1,200 mg of calcium and at least 600 mg of vitamin D per day.  If you are older than age 54, get at least 1,200 mg of calcium and at least 800 mg of vitamin D per day.  Smoking and excessive alcohol intake increase the risk of osteoporosis. Eat foods that are rich in calcium and vitamin  D, and do weight-bearing exercises several times each week as directed by your health care provider. What should I know about how menopause affects my mental health? Depression may occur at any age, but it is more common as you become older. Common symptoms of depression include:  Low or sad mood.  Changes in sleep patterns.  Changes in appetite or eating patterns.  Feeling an overall lack of motivation or enjoyment of activities that you previously enjoyed.  Frequent crying spells.  Talk with your health care provider if you think that you are experiencing depression. What should I know about immunizations? It is important that you get and maintain your immunizations. These include:  Tetanus, diphtheria, and pertussis (Tdap) booster  vaccine.  Influenza every year before the flu season begins.  Pneumonia vaccine.  Shingles vaccine.  Your health care provider may also recommend other immunizations. This information is not intended to replace advice given to you by your health care provider. Make sure you discuss any questions you have with your health care provider. Document Released: 08/29/2005 Document Revised: 01/25/2016 Document Reviewed: 04/10/2015 Elsevier Interactive Patient Education  2018 [employer].

## 2017-05-04 NOTE — Patient Instructions (Addendum)
  Jo Burke , Thank you for taking time to come for your Medicare Wellness Visit. I appreciate your ongoing commitment to your health goals. Please review the following plan we discussed and let me know if I can assist you in the future.   These are the goals we discussed: Goals    . Increase water intake       This is a list of the screening recommended for you and due dates:  Health Maintenance  Topic Date Due  . DEXA scan (bone density measurement)  02/03/1995  . Mammogram  05/01/2017  . Flu Shot  10/18/2017*  . Tetanus Vaccine  10/22/2017  . Pneumonia vaccines  Completed  *Topic was postponed. The date shown is not the original due date.

## 2017-05-22 ENCOUNTER — Encounter: Payer: Self-pay | Admitting: Internal Medicine

## 2017-06-08 DIAGNOSIS — H353222 Exudative age-related macular degeneration, left eye, with inactive choroidal neovascularization: Secondary | ICD-10-CM | POA: Diagnosis not present

## 2017-06-12 DIAGNOSIS — M79671 Pain in right foot: Secondary | ICD-10-CM | POA: Diagnosis not present

## 2017-06-25 ENCOUNTER — Other Ambulatory Visit: Payer: PPO

## 2017-07-01 ENCOUNTER — Telehealth: Payer: Self-pay

## 2017-07-01 NOTE — Telephone Encounter (Signed)
Copied from [place] [id number]. Topic: General - Other >> Jun 30, 2017 11:34 AM Jo Burke wrote: Reason for CRM: Pt call and stated that the temazepam (RESTORIL) 7.5 MG capsule is not working. She said that she still hardly sleeping and she really need something that will help her sleep at night.

## 2017-07-01 NOTE — Telephone Encounter (Signed)
Please advise 

## 2017-07-27 DIAGNOSIS — H353222 Exudative age-related macular degeneration, left eye, with inactive choroidal neovascularization: Secondary | ICD-10-CM | POA: Diagnosis not present

## 2017-08-19 ENCOUNTER — Ambulatory Visit: Payer: Self-pay | Admitting: *Deleted

## 2017-08-19 NOTE — Telephone Encounter (Signed)
Patient states she has not felt herself for about a month. She has had a dull headache for that month- but today it is getting worse. Patient has been using Tylenol and Aleve with no help- she has been up most of the night. Patient states she has had some pain in her right eye. Patient states she has been using [employer] for Cold for about a month- it has controled her cold symptoms.  Reason for Disposition . [1] Nasal discharge AND [2] present > 10 days  Answer Assessment - Initial Assessment Questions 1. LOCATION: "Where does it hurt?"      Pain in forehead 2. ONSET: "When did the sinus pain start?"  (e.g., hours, days)      2 days ago 3. SEVERITY: "How bad is the pain?"   (Scale 1-10; mild, moderate or severe)   - MILD (1-3): doesn't interfere with normal activities    - MODERATE (4-7): interferes with normal activities (e.g., work or school) or awakens from sleep   - SEVERE (8-10): excruciating pain and patient unable to do any normal activities        6 4. RECURRENT SYMPTOM: "Have you ever had sinus problems before?" If so, ask: "When was the last time?" and "What happened that time?"      no 5. NASAL CONGESTION: "Is the nose blocked?" If so, ask, "Can you open it or must you breathe through the mouth?"     No- patient is breathing through her nose 6. NASAL DISCHARGE: "Do you have discharge from your nose?" If so ask, "What color?"    clear 7. FEVER: "Do you have a fever?" If so, ask: "What is it, how was it measured, and when did it start?"      no 8. OTHER SYMPTOMS: "Do you have any other symptoms?" (e.g., sore throat, cough, earache, difficulty breathing)     no 9. PREGNANCY: "Is there any chance you are pregnant?" "When was your last menstrual period?"     n/a  Protocols used: SINUS PAIN OR CONGESTION-A-AH

## 2017-08-20 ENCOUNTER — Ambulatory Visit (INDEPENDENT_AMBULATORY_CARE_PROVIDER_SITE_OTHER): Payer: PPO | Admitting: Family Medicine

## 2017-08-20 ENCOUNTER — Encounter: Payer: Self-pay | Admitting: Family Medicine

## 2017-08-20 VITALS — BP 140/80 | HR 89 | Temp 98.2°F | Wt 127.0 lb

## 2017-08-20 DIAGNOSIS — R5383 Other fatigue: Secondary | ICD-10-CM

## 2017-08-20 DIAGNOSIS — G4700 Insomnia, unspecified: Secondary | ICD-10-CM

## 2017-08-20 LAB — CBC WITH DIFFERENTIAL/PLATELET
BASOS PCT: 0.8 % (ref 0.0–3.0)
Basophils Absolute: 0 10*3/uL (ref 0.0–0.1)
Eosinophils Absolute: 0.1 10*3/uL (ref 0.0–0.7)
Eosinophils Relative: 1.6 % (ref 0.0–5.0)
HEMATOCRIT: 39.5 % (ref 36.0–46.0)
Hemoglobin: 13.3 g/dL (ref 12.0–15.0)
LYMPHS ABS: 1.3 10*3/uL (ref 0.7–4.0)
LYMPHS PCT: 23 % (ref 12.0–46.0)
MCHC: 33.7 g/dL (ref 30.0–36.0)
MCV: 90 fl (ref 78.0–100.0)
MONOS PCT: 8.4 % (ref 3.0–12.0)
Monocytes Absolute: 0.5 10*3/uL (ref 0.1–1.0)
NEUTROS ABS: 3.9 10*3/uL (ref 1.4–7.7)
NEUTROS PCT: 66.2 % (ref 43.0–77.0)
PLATELETS: 222 10*3/uL (ref 150.0–400.0)
RBC: 4.39 Mil/uL (ref 3.87–5.11)
RDW: 14 % (ref 11.5–15.5)
WBC: 5.8 10*3/uL (ref 4.0–10.5)

## 2017-08-20 LAB — BASIC METABOLIC PANEL
BUN: 21 mg/dL (ref 6–23)
CHLORIDE: 104 meq/L (ref 96–112)
CO2: 29 mEq/L (ref 19–32)
Calcium: 9.6 mg/dL (ref 8.4–10.5)
Creatinine, Ser: 1.01 mg/dL (ref 0.40–1.20)
GFR: 55.04 mL/min — ABNORMAL LOW (ref >60.00)
Glucose, Bld: 97 mg/dL (ref 70–99)
Potassium: 3.9 mEq/L (ref 3.5–5.1)
Sodium: 139 mEq/L (ref 135–145)

## 2017-08-20 LAB — TSH: TSH: 0.87 u[IU]/mL (ref 0.35–4.50)

## 2017-08-20 LAB — VITAMIN B12: VITAMIN B 12: 644 pg/mL (ref 211–911)

## 2017-08-20 MED ORDER — TEMAZEPAM 7.5 MG PO CAPS: 7.5000 mg | ORAL_CAPSULE | Freq: Every evening | ORAL | 0 refills | Status: DC | PRN

## 2017-08-20 NOTE — Patient Instructions (Signed)
We have ordered labs or studies at this visit. It can take up to 1-2 weeks for results and processing. IF results require follow up or explanation, we will call you with instructions.  If you have not heard from [place] or cannot find your results in [facility] in 2 weeks please contact our office.  Please discontinue medication for sleep which is Tylenol PM. You can start a trial of restoril for sleep which is the medication that Dr. Derrel Nip recommended for you previously. Please do not take this medication with other medications as it will cause sleepiness.  Follow up in one month Dr. Derrel Nip for evaluation of this medication for improvement in sleep.  See sleep and fatigue information below.   Fatigue Fatigue is feeling tired all of the time, a lack of energy, or a lack of motivation. Occasional or mild fatigue is often a normal response to activity or life in general. However, long-lasting (chronic) or extreme fatigue may indicate an underlying medical condition. Follow these instructions at home: Watch your fatigue for any changes. The following actions may help to lessen any discomfort you are feeling:  Talk to your health care provider about how much sleep you need each night. Try to get the required amount every night.  Take medicines only as directed by your health care provider.  Eat a healthy and nutritious diet. Ask your health care provider if you need help changing your diet.  Drink enough fluid to keep your urine clear or pale yellow.  Practice ways of relaxing, such as yoga, meditation, massage therapy, or acupuncture.  Exercise regularly.  Change situations that cause you stress. Try to keep your work and personal routine reasonable.  Do not abuse illegal drugs.  Limit alcohol intake to no more than 1 drink per day for nonpregnant women and 2 drinks per day for men. One drink equals 12 ounces of beer, 5 ounces of wine, or 1 ounces of hard liquor.  Take a multivitamin, if  directed by your health care provider.  Contact a health care provider if:  Your fatigue does not get better.  You have a fever.  You have unintentional weight loss or gain.  You have headaches.  You have difficulty: ? Falling asleep. ? Sleeping throughout the night.  You feel angry, guilty, anxious, or sad.  You are unable to have a bowel movement (constipation).  You skin is dry.  Your legs or another part of your body is swollen. Get help right away if:  You feel confused.  Your vision is blurry.  You feel faint or pass out.  You have a severe headache.  You have severe abdominal, pelvic, or back pain.  You have chest pain, shortness of breath, or an irregular or fast heartbeat.  You are unable to urinate or you urinate less than normal.  You develop abnormal bleeding, such as bleeding from the rectum, vagina, nose, lungs, or nipples.  You vomit blood.  You have thoughts about harming yourself or committing suicide.  You are worried that you might harm someone else. This information is not intended to replace advice given to you by your health care provider. Make sure you discuss any questions you have with your health care provider. Document Released: 05/04/2007 Document Revised: 12/13/2015 Document Reviewed: 11/08/2013 Elsevier Interactive Patient Education  2018 [employer].   Insomnia Insomnia is a sleep disorder that makes it difficult to fall asleep or to stay asleep. Insomnia can cause tiredness (fatigue), low energy, difficulty concentrating,  mood swings, and poor performance at work or school. There are three different ways to classify insomnia:  Difficulty falling asleep.  Difficulty staying asleep.  Waking up too early in the morning.  Any type of insomnia can be long-term (chronic) or short-term (acute). Both are common. Short-term insomnia usually lasts for three months or less. Chronic insomnia occurs at least three times a week for  longer than three months. What are the causes? Insomnia may be caused by another condition, situation, or substance, such as:  Anxiety.  Certain medicines.  Gastroesophageal reflux disease (GERD) or other gastrointestinal conditions.  Asthma or other breathing conditions.  Restless legs syndrome, sleep apnea, or other sleep disorders.  Chronic pain.  Menopause. This may include hot flashes.  Stroke.  Abuse of alcohol, tobacco, or illegal drugs.  Depression.  Caffeine.  Neurological disorders, such as Alzheimer disease.  An overactive thyroid (hyperthyroidism).  The cause of insomnia may not be known. What increases the risk? Risk factors for insomnia include:  Gender. Women are more commonly affected than men.  Age. Insomnia is more common as you get older.  Stress. This may involve your professional or personal life.  Income. Insomnia is more common in people with lower income.  Lack of exercise.  Irregular work schedule or night shifts.  Traveling between different time zones.  What are the signs or symptoms? If you have insomnia, trouble falling asleep or trouble staying asleep is the main symptom. This may lead to other symptoms, such as:  Feeling fatigued.  Feeling nervous about going to sleep.  Not feeling rested in the morning.  Having trouble concentrating.  Feeling irritable, anxious, or depressed.  How is this treated? Treatment for insomnia depends on the cause. If your insomnia is caused by an underlying condition, treatment will focus on addressing the condition. Treatment may also include:  Medicines to help you sleep.  Counseling or therapy.  Lifestyle adjustments.  Follow these instructions at home:  Take medicines only as directed by your health care provider.  Keep regular sleeping and waking hours. Avoid naps.  Keep a sleep diary to help you and your health care provider figure out what could be causing your insomnia.  Include: ? When you sleep. ? When you wake up during the night. ? How well you sleep. ? How rested you feel the next day. ? Any side effects of medicines you are taking. ? What you eat and drink.  Make your bedroom a comfortable place where it is easy to fall asleep: ? Put up shades or special blackout curtains to block light from outside. ? Use a white noise machine to block noise. ? Keep the temperature cool.  Exercise regularly as directed by your health care provider. Avoid exercising right before bedtime.  Use relaxation techniques to manage stress. Ask your health care provider to suggest some techniques that may work well for you. These may include: ? Breathing exercises. ? Routines to release muscle tension. ? Visualizing peaceful scenes.  Cut back on alcohol, caffeinated beverages, and cigarettes, especially close to bedtime. These can disrupt your sleep.  Do not overeat or eat spicy foods right before bedtime. This can lead to digestive discomfort that can make it hard for you to sleep.  Limit screen use before bedtime. This includes: ? Watching TV. ? Using your smartphone, tablet, and computer.  Stick to a routine. This can help you fall asleep faster. Try to do a quiet activity, brush your teeth, and go to  bed at the same time each night.  Get out of bed if you are still awake after 15 minutes of trying to sleep. Keep the lights down, but try reading or doing a quiet activity. When you feel sleepy, go back to bed.  Make sure that you drive carefully. Avoid driving if you feel very sleepy.  Keep all follow-up appointments as directed by your health care provider. This is important. Contact a health care provider if:  You are tired throughout the day or have trouble in your daily routine due to sleepiness.  You continue to have sleep problems or your sleep problems get worse. Get help right away if:  You have serious thoughts about hurting yourself or someone  else. This information is not intended to replace advice given to you by your health care provider. Make sure you discuss any questions you have with your health care provider. Document Released: 07/04/2000 Document Revised: 12/07/2015 Document Reviewed: 04/07/2014 Elsevier Interactive Patient Education  [employer].

## 2017-08-20 NOTE — Progress Notes (Signed)
Patient ID: Jo Burke, female   DOB: 12-Apr-1928, 84 y.o.   MRN: [id number]  PCP: Jo Mc, MD  Subjective:  Jo Burke is a 84 y.o. year old very pleasant female patient who presents with symptoms including nasal congestion, sinus pressure/pain, and "not feeling well" -started: approximately one month ago; symptoms of nasal congestion and sinus pressure have improved however she states that she does "not feel like herself" -previous treatments: aleve has provided moderate benefit for sinus pressure -sick contacts/travel/risks: denies flu exposure. Lives at [facility] and others have been sick.  -Hx of:  Mild seasonal allergies  Associated fatigue and reports "not sleeping well"   She was advised and prescribed by her PCP a trial of restoril 7.5 mg for disturbance in her sleep. She reports not picking up this medication and trying acetaminophen pm that she picked up at her pharmacy. She states that the pharmacy did not contact her to pick up this prescription so she initiated the OTC option. While this treatment has improved sleep, she reports that sleeping more has not improved her symptom of "not feeling well". Fatigue has been present  She reports daily exercise with walking. Denies cardiopulmonary symptoms with exercise.  ROS-denies fever, chills, sweats, SOB, NVD, tooth pain, unintentional weight loss, melena, hemoptysis, hematochezia, abnormal bruising or bleeding, depressed or anxious mood   Pertinent Past Medical History- Benign essential tremor syndrome, Charles Bonnet syndrome, vitamin d deficiency    Medications- reviewed  Current Outpatient Medications  Medication Sig Dispense Refill  . Cholecalciferol (VITAMIN D3) 1000 UNITS CAPS Take 1 capsule by mouth.    . diphenhydramine-acetaminophen (TYLENOL PM) 25-500 MG TABS tablet Take 1 tablet by mouth at bedtime as needed.    . Multiple Vitamins-Minerals (PRESERVISION AREDS) CAPS Take 1 capsule by mouth 2 (two)  times daily.    . Omega-3 Fatty Acids (FISH OIL) 1000 MG CAPS Take 1 capsule by mouth 2 (two) times daily.    Marland Kitchen Raspberry Ketones 100 MG CAPS Take 1 capsule by mouth daily.     No current facility-administered medications for this visit.     Objective: BP 140/80   Pulse 89   Temp 98.2 F (36.8 C) (Oral)   Wt 127 lb (57.6 kg)   SpO2 98%   BMI 25.01 kg/m  Gen: NAD, resting comfortably HEENT: Turbinates erythematous, TM normal, pharynx mildly erythematous with no tonsilar exudate or edema, no sinus tenderness CV: RRR no murmurs rubs or gallops Lungs: CTAB no crackles, wheeze, rhonchi Abdomen: soft/nontender/nondistended/normal bowel sounds. No rebound or guarding.  Pulses: 2+ symmetric Ext: no edema Skin: warm, dry, no rash Neuro: grossly normal, moves all extremities Lymph nodes: Cervical nodes normal  Assessment/Plan: 1. Other fatigue Symptoms of URI most likely viral in nature have improved.  History provided by patient stating "not feeling well" is challenging to determine reason for this symptom. No cardiopulmonary symptoms present today. This may be most likely due to viral symptoms that are now resolving and advancing age.  We discussed my concern for acetaminophen pm daily that can cause sedation due to diphenhydramine component and it is unclear if her symptoms started after initiating this medication or before through history. She wanted limited work up for fatigue at this time. We agreed to obtain lab work only to evaluate for possible source of fatigue and she will discontinue acetaminophen pm and start trial of restoril that was advised by her PCP. Lab work will determine next steps and we discussed close follow up with  PCP if symptoms do not improve.  - CBC with Differential/Platelet - Basic metabolic panel - TSH - Vitamin B12  2. Insomnia, unspecified type Discontinue acetaminophen pm and start trial of restoril 7.5 mg at night prn; sleep hygiene reviewed. Follow up  in one month with PCP for evaluation of effectiveness and continuation of this therapy.  Delano Metz, FNP-C

## 2017-08-21 ENCOUNTER — Telehealth: Payer: Self-pay

## 2017-08-21 NOTE — Telephone Encounter (Signed)
Patient calls stating she didn't get script for temazepam.  Advised patient that it was printed and she can take hard copy script to pharmacy and get filled .  Patient verbalized understanding.

## 2017-08-24 ENCOUNTER — Other Ambulatory Visit: Payer: Self-pay

## 2017-08-24 MED ORDER — ALPRAZOLAM 0.5 MG PO TABS: 0.5000 mg | ORAL_TABLET | Freq: Every evening | ORAL | 0 refills | Status: DC | PRN

## 2017-08-24 NOTE — Telephone Encounter (Signed)
She called in December but I never received the message that the medication was not helpful.  Not that she was allergic to it. She was again prescribed it in January by Almyra Free the NP .   We will try alprazolam 0.5 mg  .  She will need to make an appointment if she requires A 3RD medication trial.

## 2017-08-24 NOTE — Telephone Encounter (Signed)
This from th NP saw patient as to restoril tried to call patient and notify of NP message no answer left voicemail have copied NP message from result note dated 08/24/17 see below PEC may give message.   Message  Received: Today  Message Contents  Delano Metz, FNP  Kerin Salen R, LPN        According to the EMR; she did not have improvement on alprazolam so PCP suggested Restoril 7.5 mg which is temazepam on 05/04/17. Patient stated during her visit with me that she did not receive Restoril from her pharmacy so decided to try the OTC Tylenol PM. I believe she has not tried Restoril from the chart review and she may think this is the same as alprazolam.  Please confirm with patient.

## 2017-08-24 NOTE — Telephone Encounter (Signed)
Pt called stating that she has not gotten on response about another sleeping pill; she states that she can not take temazepam and requested another medication; see note dated 08/21/17 "Notes recorded by Nanci Pina, LPN on 07/26/1094 at 0:45 PM EST Patient stated she did not pick up the Restoril because this was The same medication she was given in the past that did not work for her is there something else she can try?"; conference called intitated with Cyril Mourning at [facility]; [place] will contact pharmacy and route to provider for final disposition.

## 2017-08-24 NOTE — Telephone Encounter (Signed)
Patient calls stating she can't take temazepam  pharmacy will not fill  She states she is allergic and she won't take? Marland Kitchen However in reviewing chart only notate sulfa allergy.  Patient  states she can't take medication .   Still has headache she presented with headache around forehead on 08/20/17 when seen.   No fever or cough.   Spoke to pharmacy Museum/gallery conserv[employer] , she informed pharmacy that MD was supposed to write different medication or different strength.  She has tried medication back in October Dr Derrel Nip 7.5 #30 1 qd prn at bedtime.

## 2017-08-25 NOTE — Telephone Encounter (Signed)
LMTCB. PEC may speak with Jo Burke. Let Jo Burke know that Dr. Derrel Nip would like for her to try alprazolam 0.5mg  and if that doesn't work then she will need to schedule an appt with Dr. Derrel Nip to discuss a third medication trial.

## 2017-08-25 NOTE — Telephone Encounter (Signed)
Pt called to say that she went to pharmacy and noted it was Restoril and did not pick it up. She states that it was the Restoril that she took in October that "made me feel miserable". She stated that she felt the next day made her feel like she was having a "hangover" Pt states she will not take again. Pt frustrated throughout call. Routed back to provider.

## 2017-08-25 NOTE — Telephone Encounter (Signed)
LMTCB. Please transfer pt to our office.  

## 2017-08-25 NOTE — Telephone Encounter (Signed)
Spoke with pt and informed her that Dr. Derrel Nip prescribed her Alprazolam to take for sleep instead of the temazepam. The pt gave a verbal understanding.

## 2017-08-25 NOTE — Telephone Encounter (Signed)
Please advise 

## 2017-09-10 NOTE — Telephone Encounter (Signed)
See telephone message from 08/21/17

## 2017-09-28 DIAGNOSIS — H353222 Exudative age-related macular degeneration, left eye, with inactive choroidal neovascularization: Secondary | ICD-10-CM | POA: Diagnosis not present

## 2017-09-30 ENCOUNTER — Ambulatory Visit: Payer: PPO | Admitting: Internal Medicine

## 2017-10-19 ENCOUNTER — Encounter: Payer: Self-pay | Admitting: Internal Medicine

## 2017-10-19 ENCOUNTER — Ambulatory Visit (INDEPENDENT_AMBULATORY_CARE_PROVIDER_SITE_OTHER): Payer: PPO | Admitting: Internal Medicine

## 2017-10-19 ENCOUNTER — Other Ambulatory Visit: Payer: Self-pay

## 2017-10-19 VITALS — BP 132/76 | HR 79 | Temp 97.8°F | Wt 122.0 lb

## 2017-10-19 DIAGNOSIS — H5316 Psychophysical visual disturbances: Secondary | ICD-10-CM | POA: Diagnosis not present

## 2017-10-19 DIAGNOSIS — H3530 Unspecified macular degeneration: Secondary | ICD-10-CM | POA: Diagnosis not present

## 2017-10-19 DIAGNOSIS — G4701 Insomnia due to medical condition: Secondary | ICD-10-CM

## 2017-10-19 DIAGNOSIS — Z780 Asymptomatic menopausal state: Secondary | ICD-10-CM

## 2017-10-19 NOTE — Patient Instructions (Signed)
   I have ordered your bone density test  (DEXA Scan)   If the scan suggests that you have osteoporosis,  We will obtain prior authorization for Prolia

## 2017-10-19 NOTE — Progress Notes (Signed)
Subjective:  Patient ID: Jo Burke, female    DOB: 1927/11/01  Age: 84 y.o. MRN: [id number]  CC: The primary encounter diagnosis was Postmenopausal estrogen deficiency. Diagnoses of Insomnia due to medical condition, Sherran Needs syndrome, and Macular degeneration of left eye, unspecified type were also pertinent to this visit.  HPI Oriah Leinweber presents for follow up on hyperlipidemia. GAD,  Macular degeneration and Sherran Needs Syndrome    Insomnia:  Still angry that she was re prescribed both temazepam and alprazolam last month by NP  Given prior trial  of temazepam and alprazolam  Not tolerated due to "hangover "  Using otc meds .  Not tired during the day, does not take naps .  Weight loss: states that the weight loss is  intentional.  Appetite is fine .using raspberry ketones to maintain weight  Had NOrovirous two weeks ago , had N/V/D for 48 hours ,  Took a while to recover . Stools are back to normal.  Concered that her balance  Has become poor.  Lives at [facility]. Independent living . Not walking outside anymore,  Uses the inside  track and exercise class 5 days per week . But had a fall,  Struck back of head , landed onto left hip . Does not want DEXA  But willing to consider treatment with Prolia after discussion of risks and benefits.       Outpatient Medications Prior to Visit  Medication Sig Dispense Refill  . Cholecalciferol (VITAMIN D3) 1000 UNITS CAPS Take 1 capsule by mouth.    . diphenhydramine-acetaminophen (TYLENOL PM) 25-500 MG TABS tablet Take 1 tablet by mouth at bedtime as needed.    . Multiple Vitamins-Minerals (PRESERVISION AREDS) CAPS Take 1 capsule by mouth 2 (two) times daily.    Marland Kitchen Raspberry Ketones 100 MG CAPS Take 1 capsule by mouth daily.    Marland Kitchen ALPRAZolam (XANAX) 0.5 MG tablet Take 1 tablet (0.5 mg total) by mouth at bedtime as needed for anxiety or sleep. (Patient not taking: Reported on 10/19/2017) 30 tablet 0  . Omega-3 Fatty Acids (FISH  OIL) 1000 MG CAPS Take 1 capsule by mouth 2 (two) times daily.     No facility-administered medications prior to visit.     Review of Systems;  Patient denies headache, fevers, malaise, unintentional weight loss, skin rash, eye pain, sinus congestion and sinus pain, sore throat, dysphagia,  hemoptysis , cough, dyspnea, wheezing, chest pain, palpitations, orthopnea, edema, abdominal pain, nausea, melena, diarrhea, constipation, flank pain, dysuria, hematuria, urinary  Frequency, nocturia, numbness, tingling, seizures,  Focal weakness, Loss of consciousness,  Tremor, insomnia, depression, anxiety, and suicidal ideation.      Objective:  BP 132/76 (BP Location: Left Arm, Patient Position: Sitting, Cuff Size: Normal)   Pulse 79   Temp 97.8 F (36.6 C)   Wt 122 lb (55.3 kg)   SpO2 96%   BMI 24.03 kg/m   BP Readings from Last 3 Encounters:  10/19/17 132/76  08/20/17 140/80  05/04/17 132/70    Wt Readings from Last 3 Encounters:  10/19/17 122 lb (55.3 kg)  08/20/17 127 lb (57.6 kg)  05/04/17 129 lb (58.5 kg)    General appearance: alert, cooperative and appears stated age Ears: normal TM's and external ear canals both ears Throat: lips, mucosa, and tongue normal; teeth and gums normal Neck: no adenopathy, no carotid bruit, supple, symmetrical, trachea midline and thyroid not enlarged, symmetric, no tenderness/mass/nodules Back: symmetric, no curvature. ROM normal. No CVA tenderness. Lungs:  clear to auscultation bilaterally Heart: regular rate and rhythm, S1, S2 normal, no murmur, click, rub or gallop Abdomen: soft, non-tender; bowel sounds normal; no masses,  no organomegaly Pulses: 2+ and symmetric Skin: Skin color, texture, turgor normal. No rashes or lesions Lymph nodes: Cervical, supraclavicular, and axillary nodes normal. Neuro:  awake and interactive with normal mood and affect. Higher cortical functions are normal. Speech is clear without word-finding difficulty or  dysarthria. Extraocular movements are intact. Visual fields of both eyes are grossly intact. Sensation to light touch is grossly intact bilaterally of upper and lower extremities. Motor examination shows 4+/5 symmetric hand grip and upper extremity and 5/5 lower extremity strength. There is no pronation or drift. Gait is non-ataxic    No results found for: HGBA1C  Lab Results  Component Value Date   CREATININE 1.01 08/20/2017   CREATININE 0.88 05/01/2017   CREATININE 0.90 04/02/2016    Lab Results  Component Value Date   WBC 5.8 08/20/2017   HGB 13.3 08/20/2017   HCT 39.5 08/20/2017   PLT 222.0 08/20/2017   GLUCOSE 97 08/20/2017   CHOL 251 (H) 05/01/2017   TRIG 83.0 05/01/2017   HDL 68.20 05/01/2017   LDLDIRECT 159.0 05/01/2017   LDLCALC 166 (H) 05/01/2017   ALT 15 05/01/2017   AST 19 05/01/2017   NA 139 08/20/2017   K 3.9 08/20/2017   CL 104 08/20/2017   CREATININE 1.01 08/20/2017   BUN 21 08/20/2017   CO2 29 08/20/2017   TSH 0.87 08/20/2017   MICROALBUR 1.8 08/01/2011    Dg Hip Unilat W Or W/o Pelvis 2-3 Views Left  Result Date: 06/13/2015 CLINICAL DATA:  Pain.  Initial evaluation. EXAM: DG HIP (WITH OR WITHOUT PELVIS) 2-3V LEFT COMPARISON:  None. FINDINGS: Degenerative changes lumbar spine and both hips. No acute bony or joint abnormality identified. IMPRESSION: Diffuse osteopenia.  No acute abnormality. Electronically Signed   By: Marcello Moores  Register   On: 06/13/2015 13:27    Assessment & Plan:   Problem List Items Addressed This Visit    Postmenopausal estrogen deficiency - Primary    Discussed the ability to treat with Prolia if DEXA is positive,  DEXA ordered       Relevant Orders   DG Bone Density   Macular degeneration, left eye    Progressive,  Total loss of vision in right eye,  Receiving injections in left eye.  No longer drives. Still able to read using electronic media       Insomnia due to medical condition    She is intolerant of alprazolam and  temazepam.  Does not want to try any there prescribed meds.  Using melatonin. No changes today       Sherran Needs syndrome    She repots that the visual phenomenom are not disturbing or upsetting and she has adjusted to them she describes them as altered color schemes,  Seeing patterns of plaid, etc,         A total of 25 minutes of face to face time was spent with patient more than half of which was spent in counselling about the above mentioned conditions  and coordination of care   I am having Diannia Ruder maintain her PRESERVISION AREDS, Fish Oil, Raspberry Ketones, Vitamin D3, diphenhydramine-acetaminophen, and ALPRAZolam.  No orders of the defined types were placed in this encounter.   There are no discontinued medications.  Follow-up: Return in about 6 months (around 04/20/2018).   Crecencio Mc, MD

## 2017-10-20 DIAGNOSIS — Z780 Asymptomatic menopausal state: Secondary | ICD-10-CM | POA: Insufficient documentation

## 2017-10-20 NOTE — Assessment & Plan Note (Signed)
She repots that the visual phenomenom are not disturbing or upsetting and she has adjusted to them she describes them as altered color schemes,  Seeing patterns of plaid, etc,

## 2017-10-20 NOTE — Assessment & Plan Note (Signed)
Discussed the ability to treat with Prolia if DEXA is positive,  DEXA ordered

## 2017-10-20 NOTE — Assessment & Plan Note (Signed)
She is intolerant of alprazolam and temazepam.  Does not want to try any there prescribed meds.  Using melatonin. No changes today

## 2017-10-20 NOTE — Assessment & Plan Note (Signed)
Progressive,  Total loss of vision in right eye,  Receiving injections in left eye.  No longer drives. Still able to read using electronic media

## 2017-11-23 DIAGNOSIS — H1859 Other hereditary corneal dystrophies: Secondary | ICD-10-CM | POA: Diagnosis not present

## 2017-12-01 DIAGNOSIS — H353222 Exudative age-related macular degeneration, left eye, with inactive choroidal neovascularization: Secondary | ICD-10-CM | POA: Diagnosis not present

## 2017-12-31 NOTE — Telephone Encounter (Addendum)
Pt calling back to advise the  ALPRAZolam (XANAX) 0.5 MG tablet is not helping her sleep. Pt states she does not get much sleep at all.  Pt states a family member takes trazadone and she would like to try that instead.  pt states it is very hard for her to get into the office for appt.  [id number] - [place], [place] [phone] (Phone) [phone] (Fax)

## 2018-01-01 MED ORDER — TRAZODONE HCL 50 MG PO TABS: 25.0000 mg | ORAL_TABLET | Freq: Every evening | ORAL | 3 refills | Status: DC | PRN

## 2018-01-01 NOTE — Telephone Encounter (Signed)
Trazodone sent,  She should start with 1/2 tablet the first night

## 2018-01-01 NOTE — Telephone Encounter (Signed)
Spoke with pt informed her that Dr. Derrel Nip has sent in the Trazodone and that she needs to start with a half tablet for the first night. Pt gave a verbal understanding and stated that she would go pick up the medication this afternoon.

## 2018-01-01 NOTE — Addendum Note (Signed)
Addended by: Crecencio Mc on: 01/01/2018 07:50 AM   Modules accepted: Orders

## 2018-01-07 ENCOUNTER — Other Ambulatory Visit: Payer: PPO

## 2018-01-20 IMAGING — DX DG CHEST 2V
2 series · 2 of 2 positions shown · non-contrast
Comparison: 07/09/2011 chest radiograph.

CLINICAL DATA: 88 y/o F; 2-1/2 weeks of cough and shortness of
breath.

EXAM:
CHEST  2 VIEW

[chest pa]
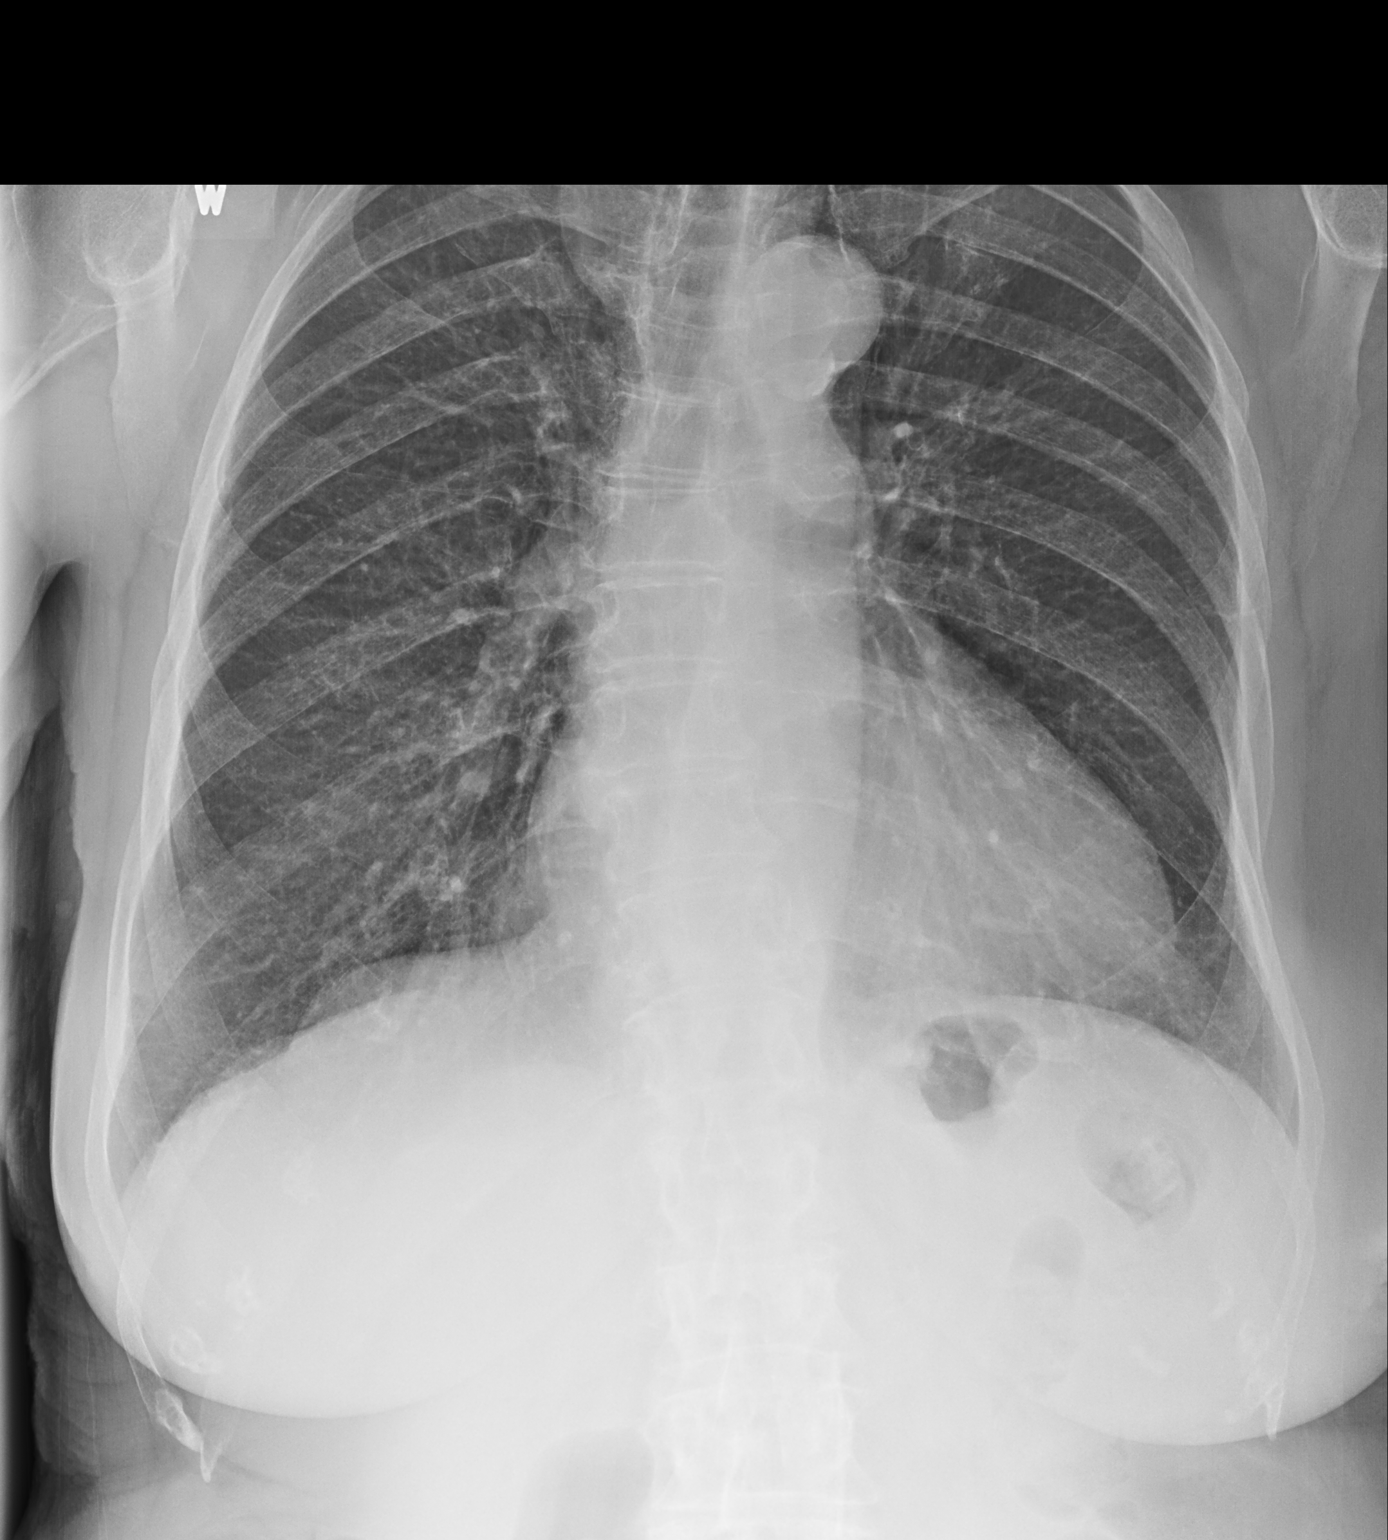

[chest lat]
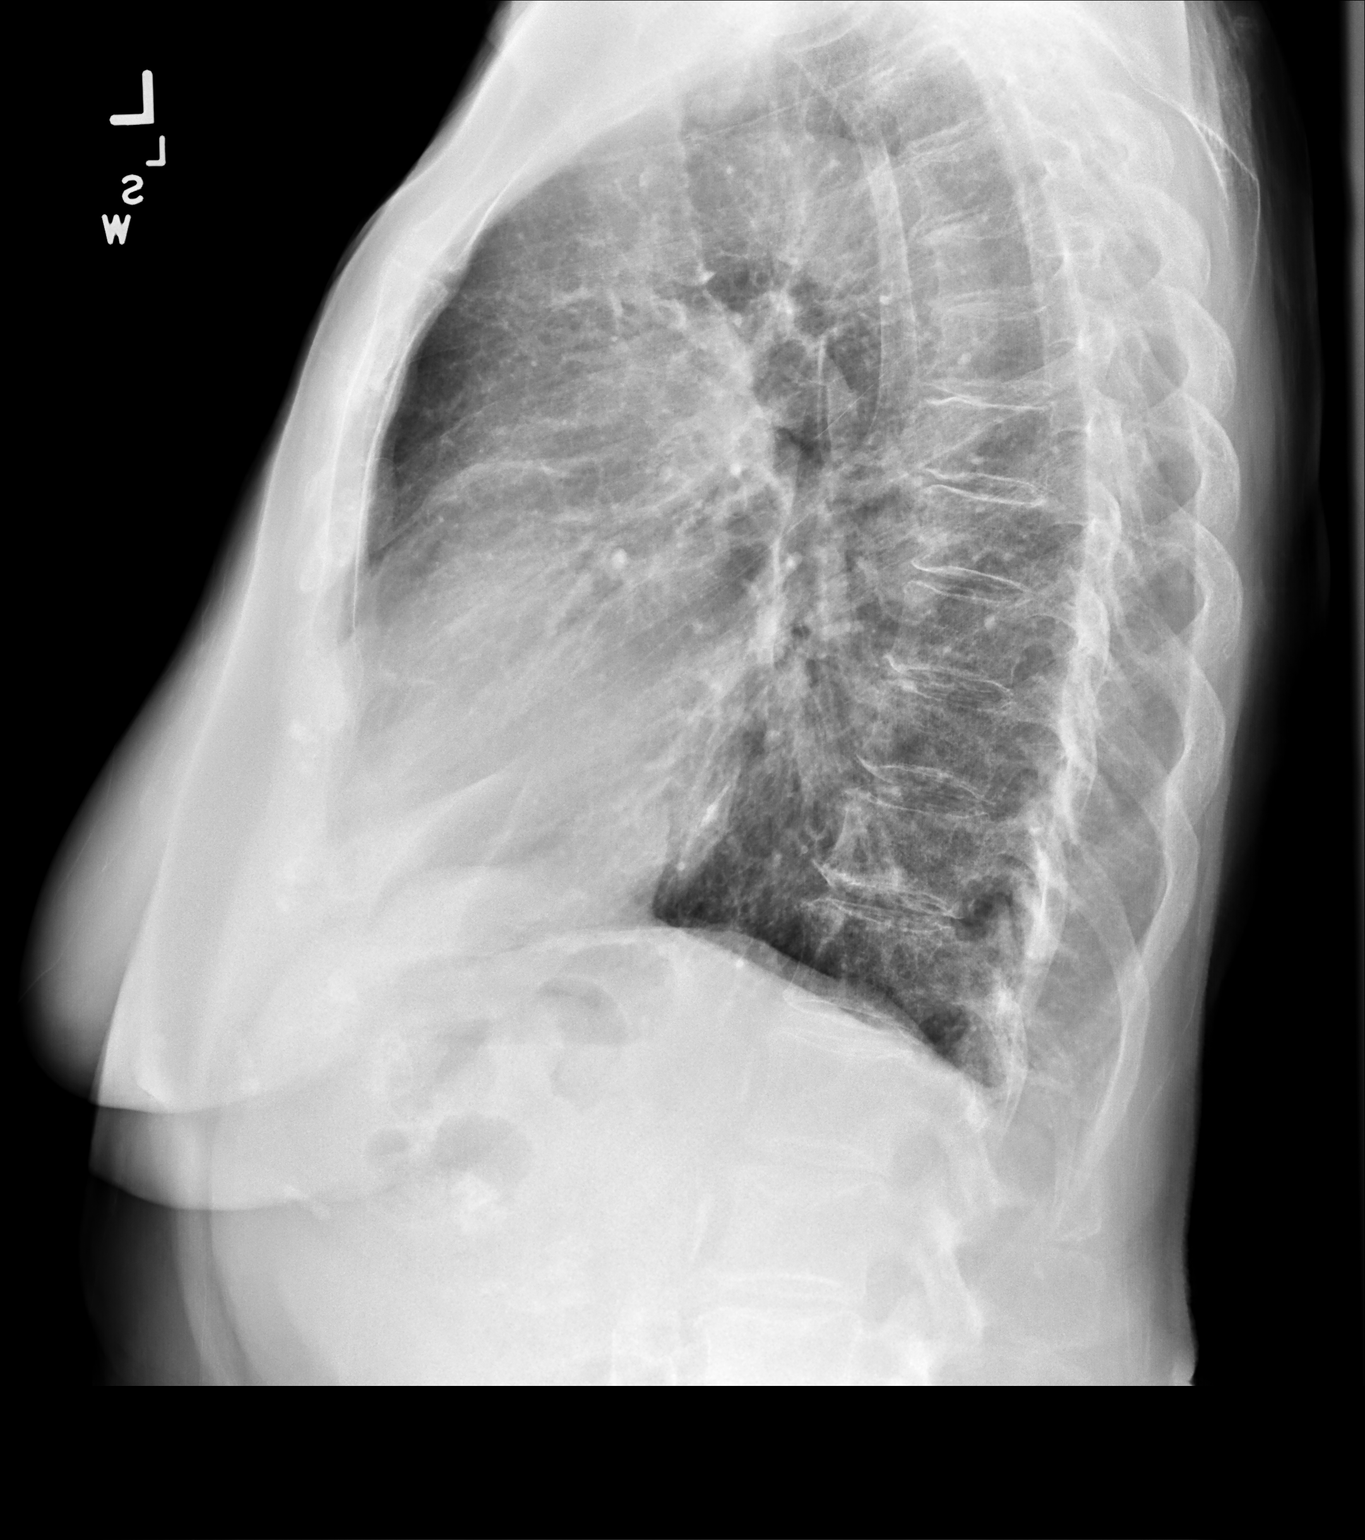

[2 of 2 positions shown; findings below may reference images not displayed]

FINDINGS: Borderline cardiomegaly. No consolidation, pneumothorax, or pleural
effusion. Mild hyperinflation of lungs may represent underlying
COPD. Mild S-shaped curvature of the spine and degenerative changes.
No acute osseous abnormality is evident. Calcific abdominal aortic
atherosclerosis.
IMPRESSION: No active cardiopulmonary disease. Lung hyperinflation may represent
COPD. Borderline cardiomegaly

By: Seleste Thibault M.D.

## 2018-02-01 DIAGNOSIS — H353222 Exudative age-related macular degeneration, left eye, with inactive choroidal neovascularization: Secondary | ICD-10-CM | POA: Diagnosis not present

## 2018-02-01 DIAGNOSIS — H353211 Exudative age-related macular degeneration, right eye, with active choroidal neovascularization: Secondary | ICD-10-CM | POA: Diagnosis not present

## 2018-04-12 DIAGNOSIS — H353222 Exudative age-related macular degeneration, left eye, with inactive choroidal neovascularization: Secondary | ICD-10-CM | POA: Diagnosis not present

## 2018-04-26 ENCOUNTER — Encounter: Payer: Self-pay | Admitting: Internal Medicine

## 2018-04-26 ENCOUNTER — Telehealth: Payer: Self-pay | Admitting: Internal Medicine

## 2018-04-26 ENCOUNTER — Ambulatory Visit (INDEPENDENT_AMBULATORY_CARE_PROVIDER_SITE_OTHER): Payer: PPO | Admitting: Internal Medicine

## 2018-04-26 VITALS — BP 140/74 | HR 58 | Temp 97.8°F | Resp 15 | Ht 59.75 in | Wt 122.0 lb

## 2018-04-26 DIAGNOSIS — H5316 Psychophysical visual disturbances: Secondary | ICD-10-CM | POA: Diagnosis not present

## 2018-04-26 DIAGNOSIS — Z0000 Encounter for general adult medical examination without abnormal findings: Secondary | ICD-10-CM

## 2018-04-26 DIAGNOSIS — E782 Mixed hyperlipidemia: Secondary | ICD-10-CM | POA: Diagnosis not present

## 2018-04-26 DIAGNOSIS — R634 Abnormal weight loss: Secondary | ICD-10-CM | POA: Diagnosis not present

## 2018-04-26 DIAGNOSIS — Z780 Asymptomatic menopausal state: Secondary | ICD-10-CM

## 2018-04-26 DIAGNOSIS — H3530 Unspecified macular degeneration: Secondary | ICD-10-CM

## 2018-04-26 DIAGNOSIS — G4701 Insomnia due to medical condition: Secondary | ICD-10-CM | POA: Diagnosis not present

## 2018-04-26 DIAGNOSIS — R351 Nocturia: Secondary | ICD-10-CM | POA: Diagnosis not present

## 2018-04-26 DIAGNOSIS — R632 Polyphagia: Secondary | ICD-10-CM | POA: Diagnosis not present

## 2018-04-26 DIAGNOSIS — R358 Other polyuria: Secondary | ICD-10-CM | POA: Diagnosis not present

## 2018-04-26 DIAGNOSIS — E785 Hyperlipidemia, unspecified: Secondary | ICD-10-CM | POA: Diagnosis not present

## 2018-04-26 DIAGNOSIS — R3589 Other polyuria: Secondary | ICD-10-CM

## 2018-04-26 LAB — CBC WITH DIFFERENTIAL/PLATELET
BASOS PCT: 0.9 % (ref 0.0–3.0)
Basophils Absolute: 0 10*3/uL (ref 0.0–0.1)
EOS ABS: 0.2 10*3/uL (ref 0.0–0.7)
EOS PCT: 4.3 % (ref 0.0–5.0)
HCT: 40.5 % (ref 36.0–46.0)
HEMOGLOBIN: 13.3 g/dL (ref 12.0–15.0)
LYMPHS PCT: 21.1 % (ref 12.0–46.0)
Lymphs Abs: 1.2 10*3/uL (ref 0.7–4.0)
MCHC: 32.8 g/dL (ref 30.0–36.0)
MCV: 90.5 fl (ref 78.0–100.0)
MONOS PCT: 11.6 % (ref 3.0–12.0)
Monocytes Absolute: 0.7 10*3/uL (ref 0.1–1.0)
Neutro Abs: 3.5 10*3/uL (ref 1.4–7.7)
Neutrophils Relative %: 62.1 % (ref 43.0–77.0)
Platelets: 221 10*3/uL (ref 150.0–400.0)
RBC: 4.48 Mil/uL (ref 3.87–5.11)
RDW: 14.2 % (ref 11.5–15.5)
WBC: 5.6 10*3/uL (ref 4.0–10.5)

## 2018-04-26 LAB — COMPREHENSIVE METABOLIC PANEL
ALT: 16 U/L (ref 0–35)
AST: 19 U/L (ref 0–37)
Albumin: 3.9 g/dL (ref 3.5–5.2)
Alkaline Phosphatase: 60 U/L (ref 39–117)
BUN: 16 mg/dL (ref 6–23)
CO2: 28 meq/L (ref 19–32)
Calcium: 9.4 mg/dL (ref 8.4–10.5)
Chloride: 104 mEq/L (ref 96–112)
Creatinine, Ser: 0.84 mg/dL (ref 0.40–1.20)
GFR: 67.97 mL/min (ref >60.00)
GLUCOSE: 81 mg/dL (ref 70–99)
POTASSIUM: 4.2 meq/L (ref 3.5–5.1)
SODIUM: 138 meq/L (ref 135–145)
Total Bilirubin: 0.6 mg/dL (ref 0.2–1.2)
Total Protein: 6.7 g/dL (ref 6.0–8.3)

## 2018-04-26 LAB — URINALYSIS, MICROSCOPIC ONLY: RBC / HPF: NONE SEEN (ref 0–?)

## 2018-04-26 LAB — POCT URINALYSIS DIPSTICK
BILIRUBIN UA: NEGATIVE
Blood, UA: NEGATIVE
Glucose, UA: NEGATIVE
Ketones, UA: NEGATIVE
Nitrite, UA: NEGATIVE
PH UA: 6 (ref 5.0–8.0)
Protein, UA: NEGATIVE
Spec Grav, UA: 1.02 (ref 1.010–1.025)
UROBILINOGEN UA: 0.2 U/dL

## 2018-04-26 LAB — LIPID PANEL
CHOL/HDL RATIO: 4
Cholesterol: 242 mg/dL — ABNORMAL HIGH (ref 0–200)
HDL: 66.4 mg/dL (ref >39.00)
LDL Cholesterol: 162 mg/dL — ABNORMAL HIGH (ref 0–99)
NONHDL: 175.22
Triglycerides: 67 mg/dL (ref 0.0–149.0)
VLDL: 13.4 mg/dL (ref 0.0–40.0)

## 2018-04-26 LAB — HEMOGLOBIN A1C: Hgb A1c MFr Bld: 5.3 % (ref 4.6–6.5)

## 2018-04-26 LAB — TSH: TSH: 1.09 u[IU]/mL (ref 0.35–4.50)

## 2018-04-26 MED ORDER — TETANUS-DIPHTH-ACELL PERTUSSIS 5-2.5-18.5 LF-MCG/0.5 IM SUSP: 0.5000 mL | Freq: Once | INTRAMUSCULAR | 0 refills | Status: AC

## 2018-04-26 MED ORDER — ZOSTER VAC RECOMB ADJUVANTED 50 MCG/0.5ML IM SUSR: 0.5000 mL | Freq: Once | INTRAMUSCULAR | 1 refills | Status: AC

## 2018-04-26 NOTE — Assessment & Plan Note (Signed)
She reports that the visual phenomenom are not disturbing or upsetting and she has adjusted to them she describes them as altered color schemes,  Seeing patterns of plaid, etc,  .  She no longer drives

## 2018-04-26 NOTE — Assessment & Plan Note (Addendum)
Checking urinalysis/culture and a1c to rule out diabetes and infection.  No signs of diabetes,  But she has mild pyruria.  Will send for culture.

## 2018-04-26 NOTE — Assessment & Plan Note (Addendum)
Mild,  No indication for therapy given age and absence of known CAD or PAD ,  Will stop checking .  Lab Results  Component Value Date   CHOL 242 (H) 04/26/2018   HDL 66.40 04/26/2018   LDLCALC 162 (H) 04/26/2018   LDLDIRECT 159.0 05/01/2017   TRIG 67.0 04/26/2018   CHOLHDL 4 04/26/2018

## 2018-04-26 NOTE — Assessment & Plan Note (Signed)

## 2018-04-26 NOTE — Assessment & Plan Note (Signed)
Progressive,  Total loss of vision in right eye,  Receiving injections in left eye.  No longer drives. Still able to read using electronic media

## 2018-04-26 NOTE — Assessment & Plan Note (Signed)
She had no relief with trazodone 50 mg and is using 2 times the normal dose of some otc med .  Cautioned her that if the medication is benadryl it will cause side effects that may be unsafe.

## 2018-04-26 NOTE — Telephone Encounter (Signed)
Copied from [place] [id number]. Topic: Quick Communication - See Telephone Encounter >> Apr 26, 2018 11:06 AM Margot Ables wrote: CRM for notification. See Telephone encounter for: 04/26/18. OTC sleep medication pt is taking is called Unisom 50mg  - pt takes 2 per night. Pt states she was to notify Dr. Derrel Nip of the name.

## 2018-04-26 NOTE — Progress Notes (Signed)
Patient ID: Jo Burke, female    DOB: 10/02/1927  Age: 84 y.o. MRN: [id number]  The patient is here for annual preventive examination and management of other chronic and acute problems.   The risk factors are reflected in the social history.  The roster of all physicians providing medical care to patient - is listed in the Snapshot section of the chart.  Activities of daily living:  The patient is 100% independent in all ADLs: dressing, toileting, feeding as well as independent mobility  Home safety : The patient has smoke detectors in the home. They wear seatbelts.  There are no firearms at home. There is no violence in the home.   There is no risks for hepatitis, STDs or HIV. There is no   history of blood transfusion. They have no travel history to infectious disease endemic areas of the world.  The patient has seen their dentist in the last six month. They have seen their eye doctor in the last year. They admit to slight hearing difficulty with regard to whispered voices and some television programs.  They have deferred audiologic testing in the last year.  They do not  have excessive sun exposure. Discussed the need for sun protection: hats, long sleeves and use of sunscreen if there is significant sun exposure.   Diet: the importance of a healthy diet is discussed. They do have a healthy diet.  The benefits of regular aerobic exercise were discussed. She exercises 5 times per week ,  60 minutes.   Depression screen: there are no signs or vegative symptoms of depression- irritability, change in appetite, anhedonia, sadness/tearfullness.  Cognitive assessment: the patient manages all their financial and personal affairs and is actively engaged. They could relate day,date,year and events; recalled 2/3 objects at 3 minutes; performed clock-face test normally.  The following portions of the patient's history were reviewed and updated as appropriate: allergies, current medications, past  family history, past medical history,  past surgical history, past social history  and problem list.  Visual acuity was not assessed per patient preference since she has regular follow up with her ophthalmologist. Hearing and body mass index were assessed and reviewed.   During the course of the visit the patient was educated and counseled about appropriate screening and preventive services including : fall prevention , diabetes screening, nutrition counseling, colorectal cancer screening, and recommended immunizations.    CC: The primary encounter diagnosis was Postmenopausal estrogen deficiency. Diagnoses of Polyuria, Weight loss, Polyphagia, Mixed hyperlipidemia, Sherran Needs syndrome, Insomnia due to medical condition, Hyperlipidemia LDL goal <160, Routine general medical examination at a health care facility, Nocturia more than twice per night, and Macular degeneration of left eye, unspecified type were also pertinent to this visit.    follow up on insomnia.    Refuses flu vaccine due to recurrent  Episodes of flu like illness after prior vaccinations.   Insomnia:  Taking 2 doses of some OTC med for insomnia .  Tried 50 mg trazodone ,  Did not help.  Has not tried 100 mg   Dry mouth:  Avoids drinking water because oof frequent urination  Craving  sweets for the past year .  Taking raspberry ketones to maintain her weight.  Eating  3 meals daily and snacks in between meals .  Polyuria and nocturia reported.     History Jo Burke has a past medical history of Macular degeneration, left eye (1992).   She has a past surgical history that includes vocal cord polyp.  Her family history includes COPD in her father; Cancer in her father; Cancer (age of onset: 65) in her brother; Cancer (age of onset: 69) in her mother.She reports that she has quit smoking. She has never used smokeless tobacco. She reports that she drinks about 5.0 standard drinks of alcohol per week. She reports that she does  not use drugs.  Outpatient Medications Prior to Visit  Medication Sig Dispense Refill  . Cholecalciferol (VITAMIN D3) 1000 UNITS CAPS Take 1 capsule by mouth.    . Multiple Vitamins-Minerals (PRESERVISION AREDS) CAPS Take 1 capsule by mouth 2 (two) times daily.    . Omega-3 Fatty Acids (FISH OIL) 1000 MG CAPS Take 1 capsule by mouth 2 (two) times daily.    Marland Kitchen Raspberry Ketones 100 MG CAPS Take 1 capsule by mouth daily.    Marland Kitchen ALPRAZolam (XANAX) 0.5 MG tablet Take 1 tablet (0.5 mg total) by mouth at bedtime as needed for anxiety or sleep. (Patient not taking: Reported on 10/19/2017) 30 tablet 0  . diphenhydramine-acetaminophen (TYLENOL PM) 25-500 MG TABS tablet Take 1 tablet by mouth at bedtime as needed.    . traZODone (DESYREL) 50 MG tablet Take 0.5-1 tablets (25-50 mg total) by mouth at bedtime as needed for sleep. (Patient not taking: Reported on 04/26/2018) 30 tablet 3   No facility-administered medications prior to visit.     Review of Systems   Patient denies headache, fevers, malaise, unintentional weight loss, skin rash, eye pain, sinus congestion and sinus pain, sore throat, dysphagia,  hemoptysis , cough, dyspnea, wheezing, chest pain, palpitations, orthopnea, edema, abdominal pain, nausea, melena, diarrhea, constipation, flank pain, dysuria, hematuria,  numbness, tingling, seizures,  Focal weakness, Loss of consciousness,  Tremor, insomnia, depression, anxiety, and suicidal ideation.      Objective:  BP 140/74 (BP Location: Left Arm, Patient Position: Sitting, Cuff Size: Normal)   Pulse (!) 58   Temp 97.8 F (36.6 C) (Oral)   Resp 15   Ht 4' 11.75" (1.518 m)   Wt 122 lb (55.3 kg)   SpO2 98%   BMI 24.03 kg/m   Physical Exam   General appearance: alert, cooperative and appears stated age Ears: normal TM's and external ear canals both ears Throat: lips, mucosa, and tongue normal; teeth and gums normal Neck: no adenopathy, no carotid bruit, supple, symmetrical, trachea midline  and thyroid not enlarged, symmetric, no tenderness/mass/nodules Back: symmetric, no curvature. ROM normal. No CVA tenderness. Lungs: clear to auscultation bilaterally Heart: regular rate and rhythm, S1, S2 normal, no murmur, click, rub or gallop Breast: deferred by patient  Abdomen: soft, non-tender; bowel sounds normal; no masses,  no organomegaly Pulses: 2+ and symmetric Skin: Skin color, texture, turgor normal. No rashes or lesions Lymph nodes: Cervical, supraclavicular, and axillary nodes normal.    Assessment & Plan:   Problem List Items Addressed This Visit    Sherran Needs syndrome    She reports that the visual phenomenom are not disturbing or upsetting and she has adjusted to them she describes them as altered color schemes,  Seeing patterns of plaid, etc,  .  She no longer drives      Hyperlipidemia LDL goal <160    Mild,  No indication for therapy given age and absence of known CAD or PAD ,  Will stop checking .  Lab Results  Component Value Date   CHOL 242 (H) 04/26/2018   HDL 66.40 04/26/2018   LDLCALC 162 (H) 04/26/2018   LDLDIRECT 159.0 05/01/2017  TRIG 67.0 04/26/2018   CHOLHDL 4 04/26/2018         Insomnia due to medical condition    She had no relief with trazodone 50 mg and is using 2 times the normal dose of some otc med .  Cautioned her that if the medication is benadryl it will cause side effects that may be unsafe.        Macular degeneration, left eye    Progressive,  Total loss of vision in right eye,  Receiving injections in left eye.  No longer drives. Still able to read using electronic media       Nocturia more than twice per night    Checking urinalysis/culture and a1c to rule out diabetes and infection.  No signs of diabetes,  But she has mild pyruria.  Will send for culture.       Relevant Orders   Urine Culture   Postmenopausal estrogen deficiency - Primary   Relevant Orders   DG Bone Density   Routine general medical examination  at a health care facility    age appropriate education and counseling updated, referrals for preventative services and immunizations addressed, dietary and smoking counseling addressed, most recent labs reviewed.  I have personally reviewed and have noted:  1) the patient's medical and social history 2) The pt's use of alcohol, tobacco, and illicit drugs 3) The patient's current medications and supplements 4) Functional ability including ADL's, fall risk, home safety risk, hearing and visual impairment 5) Diet and physical activities 6) Evidence for depression or mood disorder 7) The patient's height, weight, and BMI have been recorded in the chart  I have made referrals, and provided counseling and education based on review of the above       Other Visit Diagnoses    Polyuria       Relevant Orders   POCT urinalysis dipstick (Completed)   Urine Microscopic Only (Completed)   Weight loss       Relevant Orders   Comprehensive metabolic panel (Completed)   TSH (Completed)   CBC with Differential/Platelet (Completed)   Polyphagia       Relevant Orders   Hemoglobin A1c (Completed)   Mixed hyperlipidemia       Relevant Orders   Lipid panel (Completed)      I have discontinued Remo Lipps Tuft's diphenhydramine-acetaminophen, ALPRAZolam, and traZODone. I am also having her start on Tdap and Zoster Vaccine Adjuvanted. Additionally, I am having her maintain her PRESERVISION AREDS, Fish Oil, Raspberry Ketones, and Vitamin D3.  Meds ordered this encounter  Medications  . Tdap (BOOSTRIX) 5-2.5-18.5 LF-MCG/0.5 injection    Sig: Inject 0.5 mLs into the muscle once for 1 dose.    Dispense:  0.5 mL    Refill:  0  . Zoster Vaccine Adjuvanted [facility]) injection    Sig: Inject 0.5 mLs into the muscle once for 1 dose.    Dispense:  1 each    Refill:  1    Medications Discontinued During This Encounter  Medication Reason  . ALPRAZolam (XANAX) 0.5 MG tablet Patient Preference  .  diphenhydramine-acetaminophen (TYLENOL PM) 25-500 MG TABS tablet Patient Preference  . traZODone (DESYREL) 50 MG tablet     Follow-up: Return in about 6 months (around 10/26/2018).   Crecencio Mc, MD

## 2018-04-26 NOTE — Telephone Encounter (Signed)
FYI

## 2018-04-26 NOTE — Patient Instructions (Addendum)
Let me know which sleep medication you are taking.  We can increase the trazodone to 100 mg if necessary    Try to get at least 60 ounces of water daily as a minimum, and try to drink mostly before 7 pm  The TDap is $110 out of pocket since medicare will not pay for it,    Most pharmacies will offer it as well for far less  ([employer] supposed;y offers it for $30 to 60 as the Health Dept.)   The ShingRx vaccine is now available in local pharmacies and is much more protective thant Zostavaxs,  It is therefore ADVISED for all interested adults over 50 to prevent shingles    I have reordered your bone density test.  The office will call you with the appointment and the location/directions

## 2018-04-27 ENCOUNTER — Other Ambulatory Visit: Payer: PPO

## 2018-04-27 DIAGNOSIS — R351 Nocturia: Secondary | ICD-10-CM | POA: Diagnosis not present

## 2018-04-27 NOTE — Telephone Encounter (Signed)
Please update chart  Let patient know that Unisom is a first generation antihistamine and its use in the elderly is not recommended because of the potential side effects of confusion.  If she prefers to use it rather than trying the 100 mg dose of trazodone,  That is certainly her choice.

## 2018-04-28 ENCOUNTER — Telehealth: Payer: Self-pay | Admitting: *Deleted

## 2018-04-28 LAB — URINE CULTURE
MICRO NUMBER: 91208274
SPECIMEN QUALITY:: ADEQUATE

## 2018-04-28 NOTE — Telephone Encounter (Signed)
LMTCB. PEC may speak with pt.  

## 2018-04-28 NOTE — Telephone Encounter (Signed)
Message from Dr. Derrel Nip read to patient. States she would like to try the Trazodone 100mg .  Preferred pharmacy: [facility] [address].

## 2018-04-29 MED ORDER — TRAZODONE HCL 100 MG PO TABS: 100.0000 mg | ORAL_TABLET | Freq: Every evening | ORAL | 2 refills | Status: DC | PRN

## 2018-04-29 NOTE — Telephone Encounter (Signed)
rx request 

## 2018-04-29 NOTE — Telephone Encounter (Signed)
- 

## 2018-04-29 NOTE — Telephone Encounter (Signed)
Pt called back and was given your message below. Pt stated that she would like to try the Trazodone 100mg .

## 2018-04-29 NOTE — Telephone Encounter (Signed)
See previous message

## 2018-04-29 NOTE — Addendum Note (Signed)
Addended by: Crecencio Mc on: 04/29/2018 12:46 PM   Modules accepted: Orders

## 2018-04-30 ENCOUNTER — Telehealth: Payer: Self-pay | Admitting: Internal Medicine

## 2018-04-30 NOTE — Telephone Encounter (Signed)
Copied from [place] [id number]. Topic: General - Other >> Apr 30, 2018  4:25 PM Keene Breath wrote: Reason for CRM: Patient called to get her test results from her visit last week.  Please call patient as soon as possible at [phone].

## 2018-05-03 NOTE — Telephone Encounter (Signed)
Please see result note message. Pt is aware of a lab results.

## 2018-05-05 ENCOUNTER — Ambulatory Visit: Payer: PPO

## 2018-05-11 ENCOUNTER — Ambulatory Visit: Payer: PPO

## 2018-06-01 ENCOUNTER — Telehealth: Payer: Self-pay | Admitting: Radiology

## 2018-06-01 NOTE — Telephone Encounter (Signed)
Pt coming in for labs tomorrow, please place future orders. Thank you.  

## 2018-06-01 NOTE — Telephone Encounter (Signed)
CANCEL APPOINTMENT  NONE NEEDED

## 2018-06-01 NOTE — Telephone Encounter (Signed)
Will cancel appt per provider.

## 2018-06-02 ENCOUNTER — Telehealth: Payer: Self-pay | Admitting: Family

## 2018-06-02 ENCOUNTER — Other Ambulatory Visit: Payer: PPO

## 2018-06-02 ENCOUNTER — Ambulatory Visit (INDEPENDENT_AMBULATORY_CARE_PROVIDER_SITE_OTHER): Payer: PPO | Admitting: Family

## 2018-06-02 ENCOUNTER — Ambulatory Visit
Admission: RE | Admit: 2018-06-02 | Discharge: 2018-06-02 | Disposition: A | Discharge status: Home / Self Care | Payer: PPO | Source: Ambulatory Visit | Attending: Family | Admitting: Family

## 2018-06-02 ENCOUNTER — Encounter: Payer: Self-pay | Admitting: Family

## 2018-06-02 VITALS — BP 122/62 | HR 80 | Temp 97.8°F | Resp 15 | Ht 59.75 in | Wt 119.2 lb

## 2018-06-02 DIAGNOSIS — S0990XA Unspecified injury of head, initial encounter: Secondary | ICD-10-CM | POA: Diagnosis not present

## 2018-06-02 DIAGNOSIS — R51 Headache: Secondary | ICD-10-CM | POA: Diagnosis not present

## 2018-06-02 DIAGNOSIS — M545 Low back pain, unspecified: Secondary | ICD-10-CM

## 2018-06-02 DIAGNOSIS — I6782 Cerebral ischemia: Secondary | ICD-10-CM | POA: Insufficient documentation

## 2018-06-02 DIAGNOSIS — R519 Headache, unspecified: Secondary | ICD-10-CM | POA: Insufficient documentation

## 2018-06-02 LAB — CBC WITH DIFFERENTIAL/PLATELET
BASOS ABS: 0 10*3/uL (ref 0.0–0.1)
BASOS PCT: 0.6 % (ref 0.0–3.0)
EOS ABS: 0.1 10*3/uL (ref 0.0–0.7)
EOS PCT: 0.9 % (ref 0.0–5.0)
HCT: 42.7 % (ref 36.0–46.0)
Hemoglobin: 14.1 g/dL (ref 12.0–15.0)
LYMPHS ABS: 1.1 10*3/uL (ref 0.7–4.0)
LYMPHS PCT: 14.3 % (ref 12.0–46.0)
MCHC: 33 g/dL (ref 30.0–36.0)
MCV: 91.2 fl (ref 78.0–100.0)
MONO ABS: 0.7 10*3/uL (ref 0.1–1.0)
Monocytes Relative: 9.3 % (ref 3.0–12.0)
NEUTROS PCT: 74.9 % (ref 43.0–77.0)
Neutro Abs: 5.8 10*3/uL (ref 1.4–7.7)
PLATELETS: 222 10*3/uL (ref 150.0–400.0)
RBC: 4.68 Mil/uL (ref 3.87–5.11)
RDW: 14.6 % (ref 11.5–15.5)
WBC: 7.8 10*3/uL (ref 4.0–10.5)

## 2018-06-02 LAB — COMPREHENSIVE METABOLIC PANEL
ALK PHOS: 59 U/L (ref 39–117)
ALT: 11 U/L (ref 0–35)
AST: 15 U/L (ref 0–37)
Albumin: 4.2 g/dL (ref 3.5–5.2)
BILIRUBIN TOTAL: 0.8 mg/dL (ref 0.2–1.2)
BUN: 16 mg/dL (ref 6–23)
CO2: 30 meq/L (ref 19–32)
Calcium: 9.9 mg/dL (ref 8.4–10.5)
Chloride: 104 mEq/L (ref 96–112)
Creatinine, Ser: 0.93 mg/dL (ref 0.40–1.20)
GFR: 60.43 mL/min (ref >60.00)
GLUCOSE: 96 mg/dL (ref 70–99)
Potassium: 4.1 mEq/L (ref 3.5–5.1)
SODIUM: 139 meq/L (ref 135–145)
TOTAL PROTEIN: 7.1 g/dL (ref 6.0–8.3)

## 2018-06-02 LAB — POC INFLUENZA A&B (BINAX/QUICKVUE)
Influenza A, POC: NEGATIVE
Influenza B, POC: NEGATIVE

## 2018-06-02 MED ORDER — LIDOCAINE 5 % EX PTCH: 1.0000 | MEDICATED_PATCH | CUTANEOUS | 0 refills | Status: DC

## 2018-06-02 NOTE — Progress Notes (Signed)
Subjective:    Patient ID: Jo Burke, female    DOB: May 06, 1928, 84 y.o.   MRN: [id number]  CC: Jo Burke is a 84 y.o. female who presents today for an acute visit.    HPI: Mulitple complaints today.   Primary complaint today is  low back pain x 4 days, improving. No chills today. Started in [place] while at a [place] in car a long ways. No leg swelling, sob.   Thinks low back ache may be from exercise, water aerobics, step as well as she excerising. No numbness in  Legs, trouble urinating. No h/o back surgery.No h/o cancer.  Takes aleve daily with resolve.   Back and shoulders are aching. Taking alseltzer for cold with some improvement.   She also complains of a headache today. 'headache is there'. States 'always has a mild ha.' Unsure when it started. Not worsening. Not getting better. States moderate in pain. Frontal. No sudden vision changes, confusion.  No anticoagulants. Drinking plenty of water.   She fell after tripping on sidewalk x one week ago. No loc, head injury.  4 days ago, at funeral, missed a chair and fell backwards and hit her head on table. No bleeding.  Thinks small posterior knot. No pain in arms or legs after fall.  She had noted HA prior to fall. NO loc. No nausea, sleepiness.   A little congestion. Had one loose stool, non bloody yesterday.  No cough,  fever, wheezing, CP, diarrhea, vomiting, dysuria, abdominal pain.   Former smoker H/o macular degeneration.     HISTORY:  Past Medical History:  Diagnosis Date  . Macular degeneration, left eye 1992   [place],    Past Surgical History:  Procedure Laterality Date  . vocal cord polyp     1962   Family History  Problem Relation Age of Onset  . COPD Father   . Cancer Father        lung  . Cancer Brother 83       liver,   . Cancer Mother 67       Breast    Allergies: Alprazolam; Restoril [temazepam]; and Sulfa antibiotics Current Outpatient Medications on File Prior to Visit    Medication Sig Dispense Refill  . Cholecalciferol (VITAMIN D3) 1000 UNITS CAPS Take 1 capsule by mouth.    . Multiple Vitamins-Minerals (PRESERVISION AREDS) CAPS Take 1 capsule by mouth 2 (two) times daily.    . Omega-3 Fatty Acids (FISH OIL) 1000 MG CAPS Take 1 capsule by mouth 2 (two) times daily.    Marland Kitchen Raspberry Ketones 100 MG CAPS Take 1 capsule by mouth daily.    . traZODone (DESYREL) 100 MG tablet Take 1 tablet (100 mg total) by mouth at bedtime as needed for sleep. 30 tablet 2   No current facility-administered medications on file prior to visit.     Social History   Tobacco Use  . Smoking status: Former Research scientist (life sciences)  . Smokeless tobacco: Never Used  Substance Use Topics  . Alcohol use: Yes    Alcohol/week: 5.0 standard drinks    Types: 5 Glasses of wine per week  . Drug use: No    Review of Systems  Constitutional: Negative for chills and fever.  HENT: Positive for congestion. Negative for sinus pressure, sinus pain and sore throat.   Eyes: Negative for visual disturbance (no acute changes).  Respiratory: Negative for cough, shortness of breath and wheezing.   Cardiovascular: Negative for chest pain and palpitations.  Gastrointestinal: Positive for diarrhea (loose stool, 1 episode, resolved). Negative for nausea and vomiting.  Genitourinary: Negative for difficulty urinating, dyspareunia and dysuria.  Musculoskeletal: Positive for back pain.  Neurological: Positive for headaches. Negative for dizziness, weakness and numbness.      Objective:    BP 122/62   Pulse 80   Temp 97.8 F (36.6 C) (Oral)   Resp 15   Ht 4' 11.75" (1.518 m)   Wt 119 lb 4 oz (54.1 kg)   SpO2 97%   BMI 23.48 kg/m    Physical Exam  Constitutional: She appears well-developed and well-nourished.  HENT:  Head: Normocephalic and atraumatic.    Right Ear: Hearing, tympanic membrane, external ear and ear canal normal. No drainage, swelling or tenderness. No foreign bodies. Tympanic membrane is  not erythematous and not bulging. No middle ear effusion. No decreased hearing is noted.  Left Ear: Hearing, tympanic membrane, external ear and ear canal normal. No drainage, swelling or tenderness. No foreign bodies. Tympanic membrane is not erythematous and not bulging.  No middle ear effusion. No decreased hearing is noted.  Nose: Nose normal. No rhinorrhea. Right sinus exhibits no maxillary sinus tenderness and no frontal sinus tenderness. Left sinus exhibits no maxillary sinus tenderness and no frontal sinus tenderness.  Mouth/Throat: Uvula is midline, oropharynx is clear and moist and mucous membranes are normal. No oropharyngeal exudate, posterior oropharyngeal edema, posterior oropharyngeal erythema or tonsillar abscesses.  Small palpable ridge appreciated. No hematoma, laceration.   Eyes: Pupils are equal, round, and reactive to light. Conjunctivae, EOM and lids are normal. Lids are everted and swept, no foreign bodies found.  Normal fundus bilaterally   Cardiovascular: Normal rate, regular rhythm, normal heart sounds and normal pulses.  No LE edema, palpable cords or masses. No erythema or increased warmth. No asymmetry in calf size when compared bilaterally LE hair growth symmetric and present. No discoloration of varicosities noted. LE warm and palpable pedal pulses.   Pulmonary/Chest: Effort normal and breath sounds normal. She has no wheezes. She has no rhonchi. She has no rales.  Musculoskeletal:       Lumbar back: She exhibits normal range of motion, no tenderness, no bony tenderness, no swelling, no edema, no pain and no spasm.       Arms: Full range of motion with flexion, tension, lateral side bends. No bony tenderness. Diffuse tenderness noted across low back. No pain elicited over bilateral greater trochanter No pain, numbness, tingling elicited with single leg raise bilaterally.   Lymphadenopathy:       Head (right side): No submental, no submandibular, no tonsillar, no  preauricular, no posterior auricular and no occipital adenopathy present.       Head (left side): No submental, no submandibular, no tonsillar, no preauricular, no posterior auricular and no occipital adenopathy present.    She has no cervical adenopathy.       Right cervical: No superficial cervical, no deep cervical and no posterior cervical adenopathy present.      Left cervical: No superficial cervical, no deep cervical and no posterior cervical adenopathy present.  Neurological: She is alert. She has normal strength. No cranial nerve deficit or sensory deficit. She displays a negative Romberg sign.  Reflex Scores:      Bicep reflexes are 2+ on the right side and 2+ on the left side.      Patellar reflexes are 2+ on the right side and 2+ on the left side. Grip equal and strong bilateral upper extremities.  Gait strong and steady. Able to perform  finger-to-nose without difficulty.   Skin: Skin is warm and dry.  Psychiatric: She has a normal mood and affect. Her speech is normal and behavior is normal. Thought content normal.  Vitals reviewed.      Assessment & Plan:   1. Low back pain, unspecified back pain laterality, unspecified chronicity, unspecified whether sciatica present Mild tenderness on exam.  Suspect long car that is aggravated low back.  No red flags today.  She declines urinary testing, which I think is appropriate as she is not complaining of urinary symptoms.  We will  treat conservatively.  Patient will let me know if no improvement. - POC Influenza A&B(BINAX/QUICKVUE) - lidocaine (LIDODERM) 5 %; Place 1 patch onto the skin daily. Remove & Discard patch within 12 hours.  Dispense: 30 patch; Refill: 0 - CBC with Differential/Platelet - Comprehensive metabolic panel  2. Nonintractable headache, unspecified chronicity pattern, unspecified headache type Reassured by normal neurologic exam.  Patient is not toxic in appearance.  Patient is not on anticoagulation.  Reassured by  normal CT head.  Discussed with patient that etiology of headache is non specific at this time.  She does appear to have a history of headaches.  Advised her that perhaps congestion or viral etiology may be trigger at this time.  I emphasized the importance of staying vigilant, patient understands this, and she will let me know of any new or worsening symptoms.  - CT Head Wo Contrast    I am having Diannia Ruder start on lidocaine. I am also having her maintain her PRESERVISION AREDS, Fish Oil, Raspberry Ketones, Vitamin D3, and traZODone.   Meds ordered this encounter  Medications  . lidocaine (LIDODERM) 5 %    Sig: Place 1 patch onto the skin daily. Remove & Discard patch within 12 hours.    Dispense:  30 patch    Refill:  0    Order Specific Question:   Supervising Provider    Answer:   Crecencio Mc [[id number]]    Return precautions given.   Risks, benefits, and alternatives of the medications and treatment plan prescribed today were discussed, and patient expressed understanding.   Education regarding symptom management and diagnosis given to patient on AVS.  Continue to follow with Crecencio Mc, MD for routine health maintenance.   Diannia Ruder and I agreed with plan.   Mable Paris, FNP

## 2018-06-02 NOTE — Telephone Encounter (Signed)
With the patient regarding her negative CT head.  No acute findings.  Sinuses were clear.  Again, advised patient to stay very vigilant as etiology of headache is nonspecific at this time.  Plenty of water to help with congestion.  Patient will let us know if symptoms persist or certainly worsens.

## 2018-06-02 NOTE — Patient Instructions (Addendum)
You have several things going on today.   I am concerned by your headache and recommend a STAT Ct head at [facility].  We will call you today. Suspect congestion or viral component contributory however on the heels of your fall this weekend, we need to do imaging.   Suspect long car ride has aggregrated your low back as well.    Lidocaine pain patch for low back. Heat is so important.   Let me know of ANY new symptoms and stay vigilant.

## 2018-06-09 ENCOUNTER — Encounter: Payer: Self-pay | Admitting: Internal Medicine

## 2018-06-09 ENCOUNTER — Ambulatory Visit (INDEPENDENT_AMBULATORY_CARE_PROVIDER_SITE_OTHER): Payer: PPO

## 2018-06-09 ENCOUNTER — Ambulatory Visit (INDEPENDENT_AMBULATORY_CARE_PROVIDER_SITE_OTHER): Payer: PPO | Admitting: Internal Medicine

## 2018-06-09 VITALS — BP 130/70 | HR 72 | Temp 98.6°F | Resp 16 | Ht 59.0 in | Wt 121.1 lb

## 2018-06-09 DIAGNOSIS — Z0000 Encounter for general adult medical examination without abnormal findings: Secondary | ICD-10-CM

## 2018-06-09 DIAGNOSIS — R51 Headache: Secondary | ICD-10-CM | POA: Diagnosis not present

## 2018-06-09 DIAGNOSIS — G8929 Other chronic pain: Secondary | ICD-10-CM

## 2018-06-09 DIAGNOSIS — J040 Acute laryngitis: Secondary | ICD-10-CM

## 2018-06-09 MED ORDER — PREDNISONE 10 MG PO TABS: ORAL_TABLET | ORAL | 0 refills | Status: DC

## 2018-06-09 NOTE — Progress Notes (Addendum)
Subjective:   Jo Burke is a 84 y.o. female who presents for Medicare Annual (Subsequent) preventive examination.  Review of Systems:  No ROS.  Medicare Wellness Visit. Additional risk factors are reflected in the social history. Cardiac Risk Factors include: advanced age (>82men, >44 women)     Objective:     Vitals: BP 130/70 (BP Location: Left Arm, Patient Position: Sitting, Cuff Size: Normal)   Pulse 72   Temp 98.6 F (37 C) (Oral)   Resp 16   Ht 4\' 11"  (1.499 m)   Wt 121 lb 1.9 oz (54.9 kg)   SpO2 96%   BMI 24.46 kg/m   Body mass index is 24.46 kg/m.  Advanced Directives 06/09/2018 05/04/2017  Does Patient Have a Medical Advance Directive? Yes Yes  Type of Paramedic of [place];Living will Living will;Healthcare Power of [place];Out of facility DNR (pink MOST or yellow form)  Does patient want to make changes to medical advance directive? No - Patient declined -  Copy of [place] in Chart? Yes - validated most recent copy scanned in chart (See row information) Yes    Tobacco Social History   Tobacco Use  Smoking Status Former Smoker  Smokeless Tobacco Never Used     Counseling given: Not Answered   Clinical Intake:  Pre-visit preparation completed: Yes  Pain : No/denies pain     Nutritional Status: BMI of 19-24  Normal Diabetes: No  How often do you need to have someone help you when you read instructions, pamphlets, or other written materials from your doctor or pharmacy?: 3 - Sometimes  Interpreter Needed?: No     Past Medical History:  Diagnosis Date  . Macular degeneration, left eye 1992   [place],    Past Surgical History:  Procedure Laterality Date  . vocal cord polyp     1962   Family History  Problem Relation Age of Onset  . COPD Father   . Cancer Father        lung  . Cancer Brother 70       liver,   . Cancer Mother 71       Breast   Social History   Socioeconomic  History  . Marital status: Widowed    Spouse name: Not on file  . Number of children: Not on file  . Years of education: Not on file  . Highest education level: Not on file  Occupational History  . Not on file  Social Needs  . Financial resource strain: Not hard at all  . Food insecurity:    Worry: Never true    Inability: Never true  . Transportation needs:    Medical: No    Non-medical: No  Tobacco Use  . Smoking status: Former Research scientist (life sciences)  . Smokeless tobacco: Never Used  Substance and Sexual Activity  . Alcohol use: Yes    Alcohol/week: 5.0 standard drinks    Types: 5 Glasses of wine per week  . Drug use: No  . Sexual activity: Not Currently  Lifestyle  . Physical activity:    Days per week: 5 days    Minutes per session: 30 min  . Stress: Not at all  Relationships  . Social connections:    Talks on phone: Not on file    Gets together: Not on file    Attends religious service: Not on file    Active member of club or organization: Not on file    Attends meetings  of clubs or organizations: Not on file    Relationship status: Widowed  Other Topics Concern  . Not on file  Social History Narrative  . Not on file    Outpatient Encounter Medications as of 06/09/2018  Medication Sig  . Cholecalciferol (VITAMIN D3) 1000 UNITS CAPS Take 1 capsule by mouth.  . lidocaine (LIDODERM) 5 % Place 1 patch onto the skin daily. Remove & Discard patch within 12 hours.  . Multiple Vitamins-Minerals (PRESERVISION AREDS) CAPS Take 1 capsule by mouth 2 (two) times daily.  . Omega-3 Fatty Acids (FISH OIL) 1000 MG CAPS Take 1 capsule by mouth 2 (two) times daily.  Marland Kitchen Raspberry Ketones 100 MG CAPS Take 1 capsule by mouth daily.  . [DISCONTINUED] traZODone (DESYREL) 100 MG tablet Take 1 tablet (100 mg total) by mouth at bedtime as needed for sleep.   No facility-administered encounter medications on file as of 06/09/2018.     Activities of Daily Living In your present state of health, do  you have any difficulty performing the following activities: 06/09/2018  Hearing? N  Vision? N  Difficulty concentrating or making decisions? N  Comment Age appropriate  Walking or climbing stairs? N  Dressing or bathing? N  Doing errands, shopping? Y  Comment She does not Physiological scientist and eating ? Y  Comment She does not cook; microwave in use. Self feed.   Using the Toilet? N  In the past six months, have you accidently leaked urine? Y  Comment Urgency at times; managed.   Do you have problems with loss of bowel control? N  Managing your Medications? N  Managing your Finances? N  Housekeeping or managing your Housekeeping? Y  Comment Housekeeping assists  Some recent data might be hidden    Patient Care Team: Crecencio Mc, MD as PCP - General (Internal Medicine)    Assessment:   This is a routine wellness examination for Jo Burke.  The goal of the wellness visit is to assist the patient how to close the gaps in care and create a preventative care plan for the patient.   The roster of all physicians providing medical care to patient is listed in the Snapshot section of the chart.  Taking calcium VIT D as appropriate/Osteoporosis risk reviewed.    Safety issues reviewed; Smoke and carbon monoxide detectors in the home. No firearms or firearms locked in a safe within the home. Wears seatbelts when riding with others. No violence in the home.  They do not have excessive sun exposure.  Discussed the need for sun protection: hats, long sleeves and the use of sunscreen if there is significant sun exposure.  Patient is alert, normal appearance, oriented to person/place/and time.  Correctly identified the president of the [place] and recalls of 0/3 words. Performs simple calculations and can read correct time from watch face.  Displays appropriate judgement.  No new identified risk were noted.  No failures at ADL's or IADL's.   BMI- discussed the importance of a healthy  diet, water intake and the benefits of aerobic exercise. She does not cook.  Microwave in use for meals or facility provided meals.  Walks for exercise indoors 5 days for 60 minutes and tries to drink plenty of water.  Dental- every 6 months.  Sleep patterns- Sleep is fair.   TDAP vaccine deferred per patient preference.  Follow up with insurance.  Educational material provided.  Mammogram discontinued; patient requested. Aged out.   Exercise Activities and Dietary recommendations  Current Exercise Habits: Home exercise routine, Type of exercise: walking, Time (Minutes): 30, Frequency (Times/Week): 5, Weekly Exercise (Minutes/Week): 150, Intensity: Mild  Goals    . Increase water intake       Fall Risk Fall Risk  06/09/2018 05/04/2017 05/04/2017 03/18/2016 10/23/2014  Falls in the past year? 1 No No No No  Number falls in past yr: 1 - - - -  Injury with Fall? 1 - - - -  Comment Fall #2 [place] seat hit her head; sought medical care. Fall#1 missed a step, no injury.  - - - -  Risk for fall due to : History of fall(s) - - - Impaired vision   Depression Screen PHQ 2/9 Scores 06/09/2018 05/04/2017 05/04/2017 03/18/2016  PHQ - 2 Score 0 0 0 0  PHQ- 9 Score - 3 3 -     Cognitive Function     6CIT Screen 06/09/2018 05/04/2017  What Year? 0 points 0 points  What month? 0 points 0 points  What time? 0 points 0 points  Count back from 20 0 points 0 points  Months in reverse 0 points 0 points  Repeat phrase 0 points 0 points  Total Score 0 0    Immunization History  Administered Date(s) Administered  . Influenza Split 06/07/2012  . Pneumococcal Conjugate-13 12/21/2013  . Pneumococcal Polysaccharide-23 12/21/2009, 06/13/2015  . Tdap 10/23/2007  . Zoster 12/22/2007   Screening Tests Health Maintenance  Topic Date Due  . DEXA SCAN  02/03/1995  . TETANUS/TDAP  10/22/2017  . INFLUENZA VACCINE  10/18/2018 (Originally 02/18/2018)  . PNA vac Low Risk Adult  Completed  . MAMMOGRAM   Discontinued      Plan:    End of life planning; Advance aging; Advanced directives discussed. Copy of current HCPOA/Living Will on file.   I have personally reviewed and noted the following in the patient's chart:   . Medical and social history . Use of alcohol, tobacco or illicit drugs  . Current medications and supplements . Functional ability and status . Nutritional status . Physical activity . Advanced directives . List of other physicians . Hospitalizations, surgeries, and ER visits in previous 12 months . Vitals . Screenings to include cognitive, depression, and falls . Referrals and appointments  In addition, I have reviewed and discussed with patient certain preventive protocols, quality metrics, and best practice recommendations. A written personalized care plan for preventive services as well as general preventive health recommendations were provided to patient.     OBrien-Blaney, Reyah Streeter L, LPN  [id number]     I have reviewed the above information and agree with above.   Deborra Medina, MD

## 2018-06-09 NOTE — Patient Instructions (Addendum)
  Jo Burke , Thank you for taking time to come for your Medicare Wellness Visit. I appreciate your ongoing commitment to your health goals. Please review the following plan we discussed and let me know if I can assist you in the future.   These are the goals we discussed: Goals    . Increase water intake       This is a list of the screening recommended for you and due dates:  Health Maintenance  Topic Date Due  . DEXA scan (bone density measurement)  02/03/1995  . Mammogram  05/01/2017  . Tetanus Vaccine  10/22/2017  . Flu Shot  10/18/2018*  . Pneumonia vaccines  Completed  *Topic was postponed. The date shown is not the original due date.

## 2018-06-09 NOTE — Progress Notes (Signed)
Subjective:  Patient ID: Jo Burke, female    DOB: 1927-11-07  Age: 84 y.o. MRN: [id number] CC: Diagnoses of Chronic nonintractable headache, unspecified headache type and Laryngitis, acute were pertinent to this visit.  HPI  Jo Burke presents for  Follow up  On recent episode of body aches .  Flu test negative . No fevers,  Positive frontal /temproal  headache  Without vision  Changes.  Has had a light cough for the last day or two but not initially.  Has lost voice. .  No rhinitis or conjunctivits.  Some chronic balance issues   Goes to water aerobics  3 times per week and STAB 2 /week . Had a fall 2 weeks ago and hit the back of her head .  She was evaluated in the ER several days later for persistent posterior headache .  Head Ct was negative for acute changes   Outpatient Medications Prior to Visit  Medication Sig Dispense Refill  . Cholecalciferol (VITAMIN D3) 1000 UNITS CAPS Take 1 capsule by mouth.    . lidocaine (LIDODERM) 5 % Place 1 patch onto the skin daily. Remove & Discard patch within 12 hours. 30 patch 0  . Multiple Vitamins-Minerals (PRESERVISION AREDS) CAPS Take 1 capsule by mouth 2 (two) times daily.    . Omega-3 Fatty Acids (FISH OIL) 1000 MG CAPS Take 1 capsule by mouth 2 (two) times daily.    Marland Kitchen Raspberry Ketones 100 MG CAPS Take 1 capsule by mouth daily.     No facility-administered medications prior to visit.     Review of Systems;  Patient denies headache, fevers, malaise, unintentional weight loss, skin rash, eye pain, sinus congestion and sinus pain, sore throat, dysphagia,  hemoptysis , cough, dyspnea, wheezing, chest pain, palpitations, orthopnea, edema, abdominal pain, nausea, melena, diarrhea, constipation, flank pain, dysuria, hematuria, urinary  Frequency, nocturia, numbness, tingling, seizures,  Focal weakness, Loss of consciousness,  Tremor, insomnia, depression, anxiety, and suicidal ideation.      Objective:  BP 130/70 (BP Location: Left  Arm, Patient Position: Sitting, Cuff Size: Normal)   Pulse 72   Temp 98.6 F (37 C) (Oral)   Resp 16   Ht 4\' 11"  (1.499 m)   Wt 121 lb (54.9 kg)   SpO2 96%   BMI 24.44 kg/m   BP Readings from Last 3 Encounters:  06/09/18 130/70  06/09/18 130/70  06/02/18 122/62    Wt Readings from Last 3 Encounters:  06/09/18 121 lb (54.9 kg)  06/09/18 121 lb 1.9 oz (54.9 kg)  06/02/18 119 lb 4 oz (54.1 kg)    General appearance: alert, cooperative and appears stated age Ears: normal TM's and external ear canals both ears Throat: lips, mucosa, and tongue normal; teeth and gums normal Neck: no adenopathy, no carotid bruit, supple, symmetrical, trachea midline and thyroid not enlarged, symmetric, no tenderness/mass/nodules Back: symmetric, no curvature. ROM normal. No CVA tenderness. Lungs: clear to auscultation bilaterally Heart: regular rate and rhythm, S1, S2 normal, no murmur, click, rub or gallop Abdomen: soft, non-tender; bowel sounds normal; no masses,  no organomegaly Pulses: 2+ and symmetric Skin: Skin color, texture, turgor normal. No rashes or lesions Lymph nodes: Cervical, supraclavicular, and axillary nodes normal. Neuro:  awake and interactive with normal mood and affect. Higher cortical functions are normal. Speech is clear without word-finding difficulty or dysarthria. Extraocular movements are intact. Visual fields of both eyes are grossly intact. Sensation to light touch is grossly intact bilaterally of upper and lower extremities.  Motor examination shows 4+/5 symmetric hand grip and upper extremity and 5/5 lower extremity strength. There is no pronation or drift. Gait is non-ataxic    Lab Results  Component Value Date   HGBA1C 5.3 04/26/2018    Lab Results  Component Value Date   CREATININE 0.93 06/02/2018   CREATININE 0.84 04/26/2018   CREATININE 1.01 08/20/2017    Lab Results  Component Value Date   WBC 7.8 06/02/2018   HGB 14.1 06/02/2018   HCT 42.7 06/02/2018    PLT 222.0 06/02/2018   GLUCOSE 96 06/02/2018   CHOL 242 (H) 04/26/2018   TRIG 67.0 04/26/2018   HDL 66.40 04/26/2018   LDLDIRECT 159.0 05/01/2017   LDLCALC 162 (H) 04/26/2018   ALT 11 06/02/2018   AST 15 06/02/2018   NA 139 06/02/2018   K 4.1 06/02/2018   CL 104 06/02/2018   CREATININE 0.93 06/02/2018   BUN 16 06/02/2018   CO2 30 06/02/2018   TSH 1.09 04/26/2018   HGBA1C 5.3 04/26/2018   MICROALBUR 1.8 08/01/2011    Ct Head Wo Contrast  Result Date: 06/02/2018 CLINICAL DATA:  Pt states she hit the back of her head due to a fall by missing the chair she was sitting down in on Saturday 05/29/2018. Pt c/o posterior H/A's and unsteady gait. No hx CA. No hx cranial surg, CVA, brain aneurysm or seizures. EXAM: CT HEAD WITHOUT CONTRAST TECHNIQUE: Contiguous axial images were obtained from the base of the skull through the vertex without intravenous contrast. COMPARISON:  None. FINDINGS: Brain: No evidence of acute infarction, hemorrhage, hydrocephalus, extra-axial collection or mass lesion/mass effect. There is ventricular sulcal enlargement reflecting age-appropriate volume loss. Mild periventricular white matter hypoattenuation is also present consistent with chronic microvascular ischemic change. Vascular: No hyperdense vessel or unexpected calcification. Skull: Normal. Negative for fracture or focal lesion. Sinuses/Orbits: Visualize globes and orbits are unremarkable. The visualized sinuses and mastoid air cells are clear. Other: None. IMPRESSION: 1. No acute intracranial abnormalities. 2. Age-appropriate volume loss and mild chronic microvascular ischemic change. Electronically Signed   By: Lajean Manes M.D.   On: 06/02/2018 11:23    Assessment & Plan:   Problem List Items Addressed This Visit    Laryngitis, acute    secondary to prolonged viral URI.  Prednisone taper       Nonintractable headache    Secondary to recent fall with blunt head trauma and viral URI.  Prednisone taper            I am having Jo Burke start on predniSONE. I am also having her maintain her PRESERVISION AREDS, Fish Oil, Raspberry Ketones, Vitamin D3, and lidocaine.  Meds ordered this encounter  Medications  . predniSONE (DELTASONE) 10 MG tablet    Sig: 6 tablets on Day 1 , then reduce by 1 tablet daily until gone    Dispense:  21 tablet    Refill:  0    PLEASE DO NOT USE THE PREDPACK.  TOO DIFFICULT TO OPEN AND INSTRUCTIONS INCORRECT    There are no discontinued medications.  Follow-up: Return in about 6 months (around 12/08/2018).   Crecencio Mc, MD

## 2018-06-09 NOTE — Patient Instructions (Addendum)
I am prescribing a prednisone taper to help resolve your headache, cough and laryngitis   Take 6 tablets all at once on Day 1,  Then  5 tablets on Day 2,  Etc (taper by 1 tablet daily until gone)  Do not take this medication after 1 pm   .  It might aggravate your insomnia

## 2018-06-12 DIAGNOSIS — J040 Acute laryngitis: Secondary | ICD-10-CM | POA: Insufficient documentation

## 2018-06-12 NOTE — Assessment & Plan Note (Signed)
Secondary to recent fall with blunt head trauma and viral URI.  Prednisone taper

## 2018-06-12 NOTE — Assessment & Plan Note (Signed)
secondary to prolonged viral URI.  Prednisone taper

## 2018-06-14 ENCOUNTER — Telehealth: Payer: Self-pay | Admitting: Internal Medicine

## 2018-06-14 ENCOUNTER — Other Ambulatory Visit: Payer: Self-pay | Admitting: Internal Medicine

## 2018-06-14 NOTE — Telephone Encounter (Signed)
Copied from [place] [id number]. Topic: Quick Communication - Rx Refill/Question >> Jun 14, 2018  9:31 AM Judyann Munson wrote: Medication:predniSONE (DELTASONE) 10 MG tablet   Has the patient contacted their pharmacy? No   Preferred Pharmacy (with phone number or street name): [id number] - [address], [place] [phone] (Phone) [phone] (Fax)  Patient dropped Two pills down the drain this morning. She is needing two more pills to be sent the pharmacy listed above

## 2018-06-14 NOTE — Telephone Encounter (Signed)
Pt states that the medication was stopped because it wasn't working and then the medication was uped to 150mg  and that helped. She also states some the of the prednisone she was taking fell down the sink and she was needing 3 more called in. Please advise.

## 2018-06-14 NOTE — Telephone Encounter (Signed)
Attempted to call pt. to discuss Trazodone refill request.  This medication is not on her active medication list; noted it was marked that pt. Stopped taking it.  Left voice message for pt. To call office to clarify.

## 2018-06-14 NOTE — Telephone Encounter (Signed)
Copied from [place] [id number]. Topic: Quick Communication - Rx Refill/Question >> Jun 14, 2018  9:33 AM Judyann Munson wrote: Medication: traZODone (DESYREL) 150 MG tablet   Has the patient contacted their pharmacy? No   Preferred Pharmacy (with phone number or street name): [id number] - [address], [place] - [place] [phone] (Phone) [phone] (Fax)    Agent: Please be advised that RX refills may take up to 3 business days. We ask that you follow-up with your pharmacy.

## 2018-06-15 ENCOUNTER — Telehealth: Payer: Self-pay

## 2018-06-15 MED ORDER — TRAZODONE HCL 150 MG PO TABS: 150.0000 mg | ORAL_TABLET | Freq: Every day | ORAL | 1 refills | Status: DC

## 2018-06-15 MED ORDER — PREDNISONE 10 MG PO TABS: 10.0000 mg | ORAL_TABLET | Freq: Every day | ORAL | 0 refills | Status: DC

## 2018-06-15 NOTE — Telephone Encounter (Signed)
Requested medication (s) are due for refill today:   Requested medication (s) are on the active medication list:     Last refill: 06/09/18  Future visit scheduled yes  Notes to clinic:Pt states "Lost 2 pills down the drain this AM."  Requesting refill of 2 tabs only  Requested Prescriptions  Pending Prescriptions Disp Refills   predniSONE (DELTASONE) 10 MG tablet 21 tablet 0    Sig: 6 tablets on Day 1 , then reduce by 1 tablet daily until gone     Not Delegated - Endocrinology:  Oral Corticosteroids Failed - 06/14/2018  1:06 PM      Failed - This refill cannot be delegated      Passed - Last BP in normal range    BP Readings from Last 1 Encounters:  06/09/18 130/70         Passed - Valid encounter within last 6 months    Recent Outpatient Visits          6 days ago Chronic nonintractable headache, unspecified headache type   [facility] Crecencio Mc, MD   1 week ago Low back pain, unspecified back pain laterality, unspecified chronicity, unspecified whether sciatica present   [place], Yvetta Coder, FNP   1 month ago Routine general medical examination at a health care facility   [facility] Crecencio Mc, MD   7 months ago Postmenopausal estrogen deficiency   [facility] Crecencio Mc, MD   9 months ago Other fatigue   [place], Gregary Signs, [place]      Future Appointments            In 4 months Derrel Nip, Aris Everts, MD [place], [place]   In 12 months O'Brien-Blaney, Bryson [place], [place], [place]   In 12 months Derrel Nip, Aris Everts, MD [facility], [facility]

## 2018-06-15 NOTE — Telephone Encounter (Signed)
Trazodone and prednisone sent to pharmacy

## 2018-06-15 NOTE — Telephone Encounter (Signed)
I called patient & she stated that she had picked up new script. I also advise her to PLEASE not open pills over the sink because Dr. Derrel Nip could not continue to refill.

## 2018-06-15 NOTE — Telephone Encounter (Signed)
Is this the second spill?  Replacement 3 sent to pharmacy yesterday.  Please update   PLEASE TELL PATIENTS NOT TO OPEN BOTTLES OVER THE [place]!!!!

## 2018-06-15 NOTE — Telephone Encounter (Signed)
Submitted prior authorization for Lidoderm patches denied. Left voicemail for patient to call to adivse.

## 2018-06-15 NOTE — Telephone Encounter (Signed)
Spoke with pt to let her know that the rxs have been sent in to [facility]. Pt gave a verbal understanding.

## 2018-06-21 DIAGNOSIS — H353221 Exudative age-related macular degeneration, left eye, with active choroidal neovascularization: Secondary | ICD-10-CM | POA: Diagnosis not present

## 2018-06-21 DIAGNOSIS — H353211 Exudative age-related macular degeneration, right eye, with active choroidal neovascularization: Secondary | ICD-10-CM | POA: Diagnosis not present

## 2018-06-21 NOTE — Telephone Encounter (Signed)
Pt is aware lidoderm patches was denied

## 2018-07-26 DIAGNOSIS — H353211 Exudative age-related macular degeneration, right eye, with active choroidal neovascularization: Secondary | ICD-10-CM | POA: Diagnosis not present

## 2018-09-06 DIAGNOSIS — H353221 Exudative age-related macular degeneration, left eye, with active choroidal neovascularization: Secondary | ICD-10-CM | POA: Diagnosis not present

## 2018-09-06 DIAGNOSIS — H353211 Exudative age-related macular degeneration, right eye, with active choroidal neovascularization: Secondary | ICD-10-CM | POA: Diagnosis not present

## 2018-10-27 ENCOUNTER — Ambulatory Visit (INDEPENDENT_AMBULATORY_CARE_PROVIDER_SITE_OTHER): Payer: PPO | Admitting: Internal Medicine

## 2018-10-27 ENCOUNTER — Telehealth: Payer: Self-pay | Admitting: Internal Medicine

## 2018-10-27 DIAGNOSIS — R32 Unspecified urinary incontinence: Secondary | ICD-10-CM

## 2018-10-27 DIAGNOSIS — G4701 Insomnia due to medical condition: Secondary | ICD-10-CM

## 2018-10-27 DIAGNOSIS — Z66 Do not resuscitate: Secondary | ICD-10-CM

## 2018-10-27 DIAGNOSIS — N3281 Overactive bladder: Secondary | ICD-10-CM | POA: Diagnosis not present

## 2018-10-27 DIAGNOSIS — Z711 Person with feared health complaint in whom no diagnosis is made: Secondary | ICD-10-CM | POA: Diagnosis not present

## 2018-10-27 MED ORDER — MIRABEGRON ER 25 MG PO TB24: 25.0000 mg | ORAL_TABLET | Freq: Every day | ORAL | 5 refills | Status: DC

## 2018-10-27 NOTE — Telephone Encounter (Signed)
Spoke with pt to let her know that the medication has been sent in. Advised pt that if she can not find someone to take her to get her medication to please let us know so that we can send it to a pharmacy that will deliver it to her home. Pt gave a verbal understanding.

## 2018-10-27 NOTE — Progress Notes (Signed)
Virtual Visit via Telephone Note  I connected with Jo Burke on 10/29/18 at  8:30 AM EDT by telephone and verified that I am speaking with the correct person using two identifiers.   I discussed the limitations, risks, security and privacy concerns of performing an evaluation and management service by telephone and the availability of in person appointments. I also discussed with the patient that there may be a patient responsible charge related to this service. The patient expressed understanding and agreed to proceed.   History of Present Illness:  Follow up on insomnia,  Vision loss    Woke up with headache today   Better with tylenol  Vision not good but can use large print books and Kindle .  Wearing life alert. No recent falls,  Ordering foods from the [employer].    Friends getting her groceries at [facility],  All under quarantine    Insomnia:  Has nocturia 4-5 times per night,  Has tried every sleeping pill,  No effect,  wonders if she has sleep apnea.  Has not tried medication  for OAB    Observations/Objective:  General appearance: alert, cooperative and articulate.  No signs of being in distress  Lungs: not short of breath ,  No cough, speaking in full sentences  Psych: affect normal,dspeech is articulate and non pressured .  Denies suicidal thoughts    Assessment and Plan:  Urinary incontinence in female She has had no prior trials of medication for OAB despite nocturia 4-5 times per night  Risks and benefits discussed regarding Trial of bladder agent agreed to with myrbetriq, starting with 25 mg dose   Insomnia due to medical condition She had no relief witth multiple e trials of hypnotics,  otc antihistamines  And  Sedatives,  Discussed trial of med for OAB  DNR (do not resuscitate) Reviewed patient's  Do Not Resuscitate Orders which were made in 2016year.  Patient continues to desire DNR status and has the order prominently displaced in the home.     Updated Medication List Outpatient Encounter Medications as of 10/27/2018  Medication Sig  . Cholecalciferol (VITAMIN D3) 1000 UNITS CAPS Take 1 capsule by mouth.  . Multiple Vitamins-Minerals (PRESERVISION AREDS) CAPS Take 1 capsule by mouth 2 (two) times daily.  . Omega-3 Fatty Acids (FISH OIL) 1000 MG CAPS Take 1 capsule by mouth 2 (two) times daily.  Marland Kitchen Raspberry Ketones 100 MG CAPS Take 1 capsule by mouth daily.  . [DISCONTINUED] lidocaine (LIDODERM) 5 % Place 1 patch onto the skin daily. Remove & Discard patch within 12 hours. (Patient not taking: Reported on 10/27/2018)  . [DISCONTINUED] predniSONE (DELTASONE) 10 MG tablet 6 tablets on Day 1 , then reduce by 1 tablet daily until gone (Patient not taking: Reported on 10/27/2018)  . [DISCONTINUED] predniSONE (DELTASONE) 10 MG tablet Take 1 tablet (10 mg total) by mouth daily with breakfast. (Patient not taking: Reported on 10/27/2018)  . [DISCONTINUED] traZODone (DESYREL) 150 MG tablet Take 1 tablet (150 mg total) by mouth at bedtime. (Patient not taking: Reported on 10/27/2018)   No facility-administered encounter medications on file as of 10/27/2018.      Follow Up Instructions:    I discussed the assessment and treatment plan with the patient. The patient was provided an opportunity to ask questions and all were answered. The patient agreed with the plan and demonstrated an understanding of the instructions.   The patient was advised to call back or seek an in-person evaluation if the  symptoms worsen or if the condition fails to improve as anticipated.  I provided 25 minutes of non-face-to-face time during this encounter.   Crecencio Mc, MD

## 2018-10-27 NOTE — Telephone Encounter (Signed)
Spoke with pt and she stated that she can leave to go to the drug store. Pt would like for medication to be sent to [facility] and she will get someone to take her there.

## 2018-10-27 NOTE — Telephone Encounter (Signed)
Please call [facility] [phone] and find out if they will deliver a medication to [facility] for ms Keilman  I have not prescribed it yet because if they cannot we will find a phramacy that will

## 2018-10-27 NOTE — Telephone Encounter (Signed)
Generic myrbetriq sent.  Starting dose is 25 mg,  can be increased to 50 mg if needed

## 2018-10-29 NOTE — Assessment & Plan Note (Signed)
She had no relief witth multiple e trials of hypnotics,  otc antihistamines  And  Sedatives,  Discussed trial of med for OAB

## 2018-10-29 NOTE — Assessment & Plan Note (Signed)
Reviewed patient's  Do Not Resuscitate Orders which were made in 2016year.  Patient continues to desire DNR status and has the order prominently displaced in the home.

## 2018-10-29 NOTE — Assessment & Plan Note (Signed)
She has had no prior trials of medication for OAB despite nocturia 4-5 times per night  Risks and benefits discussed regarding Trial of bladder agent agreed to with myrbetriq, starting with 25 mg dose

## 2018-11-15 DIAGNOSIS — H353211 Exudative age-related macular degeneration, right eye, with active choroidal neovascularization: Secondary | ICD-10-CM | POA: Diagnosis not present

## 2018-11-15 DIAGNOSIS — H353221 Exudative age-related macular degeneration, left eye, with active choroidal neovascularization: Secondary | ICD-10-CM | POA: Diagnosis not present

## 2018-12-27 DIAGNOSIS — H353211 Exudative age-related macular degeneration, right eye, with active choroidal neovascularization: Secondary | ICD-10-CM | POA: Diagnosis not present

## 2018-12-27 DIAGNOSIS — H353221 Exudative age-related macular degeneration, left eye, with active choroidal neovascularization: Secondary | ICD-10-CM | POA: Diagnosis not present

## 2019-01-17 ENCOUNTER — Other Ambulatory Visit: Payer: Self-pay

## 2019-01-17 ENCOUNTER — Encounter: Payer: Self-pay | Admitting: Internal Medicine

## 2019-01-17 ENCOUNTER — Ambulatory Visit (INDEPENDENT_AMBULATORY_CARE_PROVIDER_SITE_OTHER): Payer: PPO | Admitting: Internal Medicine

## 2019-01-17 VITALS — Wt 112.0 lb

## 2019-01-17 DIAGNOSIS — R634 Abnormal weight loss: Secondary | ICD-10-CM | POA: Diagnosis not present

## 2019-01-17 NOTE — Progress Notes (Signed)
Pt stated that over the last several weeks she has been losing weight very fast. Pt stated that in a 10 day period she went from 120 to 112. She stated that she has been eating "bad" to see if she could gain some the weight back and has not been able to.

## 2019-01-17 NOTE — Progress Notes (Addendum)
Telephone  Note  This visit type was conducted due to national recommendations for restrictions regarding the COVID-19 pandemic (e.g. social distancing).  This format is felt to be most appropriate for this patient at this time.  All issues noted in this document were discussed and addressed.  No physical exam was performed (except for noted visual exam findings with Video Visits).   I connected with@ on 01/17/19 at 11:30 AM EDT by a video enabled telemedicine application or telephone and verified that I am speaking with the correct person using two identifiers. Location patient: home Location provider: work or home office Persons participating in the virtual visit: patient, provider  I discussed the limitations, risks, security and privacy concerns of performing an evaluation and management service by telephone and the availability of in person appointments. I also discussed with the patient that there may be a patient responsible charge related to this service. The patient expressed understanding and agreed to proceed.  Reason for visit: unintentional weight loss HPI: .85 yr old very healthy patient has noticed that she began losing weight on a daily basis  , started about 10 days ago. She is adamant that her weight was stable in  May .   Appetite has been good,  She is losing weight in spite of eating 3 meals per day .  Has been having balance problems which have been progressing , and I her unsteadiness is s aggravated by using facemask.  She denies any history  Of falls.   Walks daily for exercise about a mile daily,  No shortness of breath. Has chronic headaches daily managed with aleve,   No nausea , abd pain or  Black stools.    Diet reviewed:  Cereal for breakfast with fruit and/or toast,  Activia in the morning  Lunch is a sandwich   ROS: See pertinent positives and negatives per HPI.  Past Medical History:  Diagnosis Date  . Macular degeneration, left eye 1992   appenzeller,      Past Surgical History:  Procedure Laterality Date  . vocal cord polyp     1962    Family History  Problem Relation Age of Onset  . COPD Father   . Cancer Father        lung  . Cancer Brother 43       liver,   . Cancer Mother 74       Breast    SOCIAL HX: LIVES ALONE AT [facility].    Current Outpatient Medications:  .  Cholecalciferol (VITAMIN D3) 1000 UNITS CAPS, Take 1 capsule by mouth., Disp: , Rfl:  .  Multiple Vitamins-Minerals (PRESERVISION AREDS) CAPS, Take 1 capsule by mouth 2 (two) times daily., Disp: , Rfl:  .  Omega-3 Fatty Acids (FISH OIL) 1000 MG CAPS, Take 1 capsule by mouth 2 (two) times daily., Disp: , Rfl:   EXAM:  VITALS per patient if applicable:  GENERAL: alert, oriented, appears well and in no acute distress  HEENT: atraumatic, conjunttiva clear, no obvious abnormalities on inspection of external nose and ears  NECK: normal movements of the head and neck  LUNGS: on inspection no signs of respiratory distress, breathing rate appears normal, no obvious gross SOB, gasping or wheezing  CV: no obvious cyanosis  MS: moves all visible extremities without noticeable abnormality  PSYCH/NEURO: pleasant and cooperative, no obvious depression or anxiety, speech and thought processing grossly intact  ASSESSMENT AND PLAN:  Discussed the following assessment and plan:  Unintentional weight loss  of 1-2% body weight within 1 week Etiology unclear and timeline in question after discussion with patient's devoted daughter who is Luretha Rued , retired Therapist, sports .  Patient does not desire a workup other than blood tests.     I discussed the assessment and treatment plan with the patient. The patient was provided an opportunity to ask questions and all were answered. The patient agreed with the plan and demonstrated an understanding of the instructions.   The patient was advised to call back or seek an in-person evaluation if the symptoms worsen or if the  condition fails to improve as anticipated.  I provided 30 minutes of non-face-to-face time during this encounter with patient followed by 20 minutes with daughter nancy Unknown Foley, MD

## 2019-01-18 ENCOUNTER — Other Ambulatory Visit (INDEPENDENT_AMBULATORY_CARE_PROVIDER_SITE_OTHER): Payer: PPO

## 2019-01-18 ENCOUNTER — Other Ambulatory Visit: Payer: Self-pay

## 2019-01-18 DIAGNOSIS — R634 Abnormal weight loss: Secondary | ICD-10-CM | POA: Insufficient documentation

## 2019-01-18 NOTE — Assessment & Plan Note (Signed)
Etiology unclear and timeline in question after discussion with patient's devoted daughter who is Jo Burke , retired Therapist, sports .  Patient does not desire a workup other than blood tests.

## 2019-01-19 LAB — CBC WITH DIFFERENTIAL/PLATELET
Basophils Absolute: 0.1 10*3/uL (ref 0.0–0.1)
Basophils Relative: 1.2 % (ref 0.0–3.0)
Eosinophils Absolute: 0.1 10*3/uL (ref 0.0–0.7)
Eosinophils Relative: 1.7 % (ref 0.0–5.0)
HCT: 40.2 % (ref 36.0–46.0)
Hemoglobin: 13.3 g/dL (ref 12.0–15.0)
Lymphocytes Relative: 20.8 % (ref 12.0–46.0)
Lymphs Abs: 1.1 10*3/uL (ref 0.7–4.0)
MCHC: 33.1 g/dL (ref 30.0–36.0)
MCV: 92.5 fl (ref 78.0–100.0)
Monocytes Absolute: 0.6 10*3/uL (ref 0.1–1.0)
Monocytes Relative: 10.5 % (ref 3.0–12.0)
Neutro Abs: 3.6 10*3/uL (ref 1.4–7.7)
Neutrophils Relative %: 65.8 % (ref 43.0–77.0)
Platelets: 196 10*3/uL (ref 150.0–400.0)
RBC: 4.34 Mil/uL (ref 3.87–5.11)
RDW: 14.9 % (ref 11.5–15.5)
WBC: 5.4 10*3/uL (ref 4.0–10.5)

## 2019-01-19 LAB — COMPREHENSIVE METABOLIC PANEL
ALT: 8 U/L (ref 0–35)
AST: 15 U/L (ref 0–37)
Albumin: 4 g/dL (ref 3.5–5.2)
Alkaline Phosphatase: 45 U/L (ref 39–117)
BUN: 21 mg/dL (ref 6–23)
CO2: 25 mEq/L (ref 19–32)
Calcium: 9.6 mg/dL (ref 8.4–10.5)
Chloride: 107 mEq/L (ref 96–112)
Creatinine, Ser: 0.91 mg/dL (ref 0.40–1.20)
GFR: 58.21 mL/min — ABNORMAL LOW (ref >60.00)
Glucose, Bld: 87 mg/dL (ref 70–99)
Potassium: 4.5 mEq/L (ref 3.5–5.1)
Sodium: 140 mEq/L (ref 135–145)
Total Bilirubin: 0.6 mg/dL (ref 0.2–1.2)
Total Protein: 6.4 g/dL (ref 6.0–8.3)

## 2019-01-19 LAB — HEMOGLOBIN A1C: Hgb A1c MFr Bld: 5.4 % (ref 4.6–6.5)

## 2019-01-19 LAB — VITAMIN B12: Vitamin B-12: 208 pg/mL — ABNORMAL LOW (ref 211–911)

## 2019-01-19 LAB — TSH: TSH: 0.63 u[IU]/mL (ref 0.35–4.50)

## 2019-01-20 ENCOUNTER — Encounter: Payer: Self-pay | Admitting: Internal Medicine

## 2019-01-20 DIAGNOSIS — E538 Deficiency of other specified B group vitamins: Secondary | ICD-10-CM | POA: Insufficient documentation

## 2019-01-27 ENCOUNTER — Telehealth: Payer: Self-pay | Admitting: Internal Medicine

## 2019-01-27 NOTE — Telephone Encounter (Signed)
Patient called to get lab results back, patient would like PCP or nurse to call. Call back [phone]

## 2019-01-28 ENCOUNTER — Ambulatory Visit (INDEPENDENT_AMBULATORY_CARE_PROVIDER_SITE_OTHER): Payer: PPO | Admitting: Internal Medicine

## 2019-01-28 ENCOUNTER — Encounter: Payer: Self-pay | Admitting: Internal Medicine

## 2019-01-28 ENCOUNTER — Other Ambulatory Visit: Payer: Self-pay

## 2019-01-28 DIAGNOSIS — E538 Deficiency of other specified B group vitamins: Secondary | ICD-10-CM

## 2019-01-28 DIAGNOSIS — R634 Abnormal weight loss: Secondary | ICD-10-CM

## 2019-01-28 DIAGNOSIS — G3184 Mild cognitive impairment, so stated: Secondary | ICD-10-CM | POA: Diagnosis not present

## 2019-01-28 NOTE — Progress Notes (Signed)
Telephone Note  This visit type was conducted due to national recommendations for restrictions regarding the COVID-19 pandemic (e.g. social distancing).  This format is felt to be most appropriate for this patient at this time.  All issues noted in this document were discussed and addressed.  No physical exam was performed (except for noted visual exam findings with Video Visits).   I connected with@ on 01/28/19 at 11:30 AM EDT by telephone and verified that I am speaking with the correct person using two identifiers. Location patient: home Location provider: work or home office Persons participating in the virtual visit: patient, provider  I discussed the limitations, risks, security and privacy concerns of performing an evaluation and management service by telephone and the availability of in person appointments. I also discussed with the patient that there may be a patient responsible charge related to this service. The patient expressed understanding and agreed to proceed.   Reason for visit: 2 week follow up on recent report of weight loss complicated by cognitive changes    HPI:  85 yr old female with recent report of unintentional weight loss presents for follow up on screening labs done. Patient had given me verbal permission to speak with her daughter  Hellen Shanley, who is a retired Therapist, sports and has been in close contact with patient albeit limited by the COVID 19 restrictions imposed by [facility].  We had jointly decided that we would not embark on an exhaustive workup given her age.  Therefore,  Screening labs were done to rule out thyroid dysfunction, anemia,  And renal failure.   Labs have been reviewed with patient and it was noted that her b12 was low . Patient states that she has no access to injectibles because [facility] will not provide the administration and will not allow her daughter to administer.  We ultimately decided that her daughter would supply oral medication to take  daily.    She has been  worried about cancer but does not want a workup other than labs.  She is drinking Ensure now.  She has not noticed any subsequent weight loss and feels food , other than being bored and tired of the isolation imposed on [facility] residents.   ROS: See pertinent positives and negatives per HPI.  Past Medical History:  Diagnosis Date  . Macular degeneration, left eye 1992   appenzeller,     Past Surgical History:  Procedure Laterality Date  . vocal cord polyp     1962    Family History  Problem Relation Age of Onset  . COPD Father   . Cancer Father        lung  . Cancer Brother 64       liver,   . Cancer Mother 78       Breast    SOCIAL HX: resident of [facility] .  reports that she has quit smoking. She has never used smokeless tobacco. She reports current alcohol use of about 5.0 standard drinks of alcohol per week. She reports that she does not use drugs.   Current Outpatient Medications:  .  Cholecalciferol (VITAMIN D3) 1000 UNITS CAPS, Take 1 capsule by mouth., Disp: , Rfl:  .  Multiple Vitamins-Minerals (PRESERVISION AREDS) CAPS, Take 1 capsule by mouth 2 (two) times daily., Disp: , Rfl:  .  Omega-3 Fatty Acids (FISH OIL) 1000 MG CAPS, Take 1 capsule by mouth 2 (two) times daily., Disp: , Rfl:   EXAM:   General  impression: alert, cooperative and articulate.  No signs of being in distress  Lungs: speech is fluent sentence length suggests that patient is not short of breath and not punctuated by cough, sneezing or sniffing. Marland Kitchen   Psych: affect normal.  speech is articulate and non pressured .  Denies suicidal thoughts   ASSESSMENT AND PLAN:  Unintentional weight loss of 1-2% body weight within 1 week The amount of actual weight loss is unclear after discussion with patient's daughter [name], who notes some persistent signs of cognitive decline in her mother.  Her weight has plateau ed with the daily addition of a nutritional supplement.   Screening labs normal except for low B 12, which has been addressed . No further workup planned per patient request  B12 deficiency She is unable to have b12 injections currently and will start a daily oral supplement instead   Lab Results  Component Value Date   VITAMINB12 208 (L) 01/18/2019     Mild cognitive impairment with memory loss Noticed by her daughter in the last several months,   May be related to b12 deficiency     I discussed the assessment and treatment plan with the patient. The patient was provided an opportunity to ask questions and all were answered. The patient agreed with the plan and demonstrated an understanding of the instructions.   The patient was advised to call back or seek an in-person evaluation if the symptoms worsen or if the condition fails to improve as anticipated.  I provided 22 minutes of non-face-to-face time during this encounter.   Crecencio Mc, MD

## 2019-01-28 NOTE — Telephone Encounter (Signed)
Spoke with pt and informed her of her lab results and pt stated that she would like to speak with Dr. Derrel Nip in regards to her lab results and weight loss. Pt is scheduled for today at 11:30 telephone visit. Pt is aware of appt date and time.

## 2019-01-30 DIAGNOSIS — F0390 Unspecified dementia without behavioral disturbance: Secondary | ICD-10-CM | POA: Insufficient documentation

## 2019-01-30 DIAGNOSIS — G3184 Mild cognitive impairment, so stated: Secondary | ICD-10-CM | POA: Insufficient documentation

## 2019-01-30 NOTE — Assessment & Plan Note (Signed)
Noticed by her daughter in the last several months,   May be related to b12 deficiency

## 2019-01-30 NOTE — Assessment & Plan Note (Signed)
The amount of actual weight loss is unclear after discussion with patient's daughter Jo Burke, who notes some persistent signs of cognitive decline in her mother.  Her weight has plateau ed with the daily addition of a nutritional supplement.  Screening labs normal except for low B 12, which has been addressed . No further workup planned per patient request

## 2019-01-30 NOTE — Assessment & Plan Note (Signed)
She is unable to have b12 injections currently and will start a daily oral supplement instead   Lab Results  Component Value Date   VITAMINB12 208 (L) 01/18/2019

## 2019-02-21 DIAGNOSIS — H353211 Exudative age-related macular degeneration, right eye, with active choroidal neovascularization: Secondary | ICD-10-CM | POA: Diagnosis not present

## 2019-02-21 DIAGNOSIS — H353221 Exudative age-related macular degeneration, left eye, with active choroidal neovascularization: Secondary | ICD-10-CM | POA: Diagnosis not present

## 2019-05-16 DIAGNOSIS — H353211 Exudative age-related macular degeneration, right eye, with active choroidal neovascularization: Secondary | ICD-10-CM | POA: Diagnosis not present

## 2019-05-16 DIAGNOSIS — H353221 Exudative age-related macular degeneration, left eye, with active choroidal neovascularization: Secondary | ICD-10-CM | POA: Diagnosis not present

## 2019-06-13 ENCOUNTER — Other Ambulatory Visit: Payer: Self-pay

## 2019-06-13 ENCOUNTER — Ambulatory Visit (INDEPENDENT_AMBULATORY_CARE_PROVIDER_SITE_OTHER): Payer: PPO | Admitting: Internal Medicine

## 2019-06-13 ENCOUNTER — Encounter: Payer: Self-pay | Admitting: Internal Medicine

## 2019-06-13 ENCOUNTER — Ambulatory Visit (INDEPENDENT_AMBULATORY_CARE_PROVIDER_SITE_OTHER): Payer: PPO

## 2019-06-13 VITALS — Ht 59.0 in | Wt 118.0 lb

## 2019-06-13 DIAGNOSIS — R634 Abnormal weight loss: Secondary | ICD-10-CM

## 2019-06-13 DIAGNOSIS — E559 Vitamin D deficiency, unspecified: Secondary | ICD-10-CM | POA: Diagnosis not present

## 2019-06-13 DIAGNOSIS — M545 Low back pain, unspecified: Secondary | ICD-10-CM

## 2019-06-13 DIAGNOSIS — Z0000 Encounter for general adult medical examination without abnormal findings: Secondary | ICD-10-CM | POA: Diagnosis not present

## 2019-06-13 DIAGNOSIS — E538 Deficiency of other specified B group vitamins: Secondary | ICD-10-CM | POA: Diagnosis not present

## 2019-06-13 DIAGNOSIS — H3530 Unspecified macular degeneration: Secondary | ICD-10-CM

## 2019-06-13 NOTE — Assessment & Plan Note (Signed)
Progressive,  Total loss of vision in right eye,  Receiving injections in left eye.  No longer drives. Still able to read using electronic media

## 2019-06-13 NOTE — Assessment & Plan Note (Signed)
She is unable to have b12 injections currently and has been taking a daily oral supplement since June.  Repeat assessment needed.    Lab Results  Component Value Date   VITAMINB12 208 (L) 01/18/2019

## 2019-06-13 NOTE — Progress Notes (Signed)
Telephone Note   This visit type was conducted due to national recommendations for restrictions regarding the COVID-19 pandemic (e.g. social distancing).  This format is felt to be most appropriate for this patient at this time.  All issues noted in this document were discussed and addressed.  No physical exam was performed (except for noted visual exam findings with Video Visits).   I connected with@ on 06/13/19 at 10:00 AM EST by telephone and verified that I am speaking with the correct person using two identifiers. Location patient: home Location provider: work or home office Persons participating in the virtual visit: patient, provider  I discussed the limitations, risks, security and privacy concerns of performing an evaluation and management service by telephone and the availability of in person appointments. I also discussed with the patient that there may be a patient responsible charge related to this service. The patient expressed understanding and agreed to proceed.  Reason for visit:  Follow up on unintentional weight loss noted I April .   HPI:  85 yr old female , resident of [facility]. Doing better,  Has resumed walking nearly daily and eating better too.  However she remains very  concerned about getting a COVID 19 infection and has decided not to visit her daughter and family because of fear of infection .    She denies symptoms of depression.  She has not had any recent falls.   She  Is taking oral b12 since June when her level was low    ROS: See pertinent positives and negatives per HPI.  Past Medical History:  Diagnosis Date  . Macular degeneration, left eye 1992   appenzeller,     Past Surgical History:  Procedure Laterality Date  . vocal cord polyp     1962    Family History  Problem Relation Age of Onset  . COPD Father   . Cancer Father        lung  . Cancer Brother 28       liver,   . Cancer Mother 45       Breast    SOCIAL HX:  reports that  she has quit smoking. She has never used smokeless tobacco. She reports current alcohol use of about 5.0 standard drinks of alcohol per week. She reports that she does not use drugs. Lives at [facility]. Her daughter [name] is a retired  Therapist, sports and lives in [place]    Current Outpatient Medications:  .  Cholecalciferol (VITAMIN D3) 1000 UNITS CAPS, Take 1 capsule by mouth., Disp: , Rfl:  .  Multiple Vitamins-Minerals (PRESERVISION AREDS) CAPS, Take 1 capsule by mouth 2 (two) times daily., Disp: , Rfl:  .  Omega-3 Fatty Acids (FISH OIL) 1000 MG CAPS, Take 1 capsule by mouth 2 (two) times daily., Disp: , Rfl:  .  traZODone (DESYREL) 150 MG tablet, Take 150 mg by mouth at bedtime., Disp: , Rfl:  .  vitamin B-12 (CYANOCOBALAMIN) 1000 MCG tablet, Take 1,000 mcg by mouth daily., Disp: , Rfl:   EXAM:   General impression: alert, cooperative and articulate.  No signs of being in distress  Lungs: speech is fluent sentence length suggests that patient is not short of breath and not punctuated by cough, sneezing or sniffing. Marland Kitchen   Psych: affect normal.  speech is articulate and non pressured .  Denies suicidal thoughts    ASSESSMENT AND PLAN:  Discussed the following assessment and plan:  B12 deficiency - Plan: Vitamin B12, RBC Folate,  CBC with Differential/Platelet, Intrinsic Factor Antibodies  Vitamin D deficiency - Plan: VITAMIN D 25 Hydroxy (Vit-D Deficiency, Fractures)  Low back pain, unspecified back pain laterality, unspecified chronicity, unspecified whether sciatica present  Macular degeneration of left eye, unspecified type  Unintentional weight loss of 1-2% body weight within 1 week  Lumbago Chronic, non radiating Mild, intermittent,  Managed with aleve and tylenol.   Macular degeneration, left eye Progressive,  Total loss of vision in right eye,  Receiving injections in left eye.  No longer drives. Still able to read using electronic media   B12 deficiency She is unable to have  b12 injections currently and has been taking a daily oral supplement since June.  Repeat assessment needed.    Lab Results  Component Value Date   [id number] (L) 01/18/2019     Unintentional weight loss of 1-2% body weight within 1 week Resolved,  She has gained 6 lbs since her last visit.     I discussed the assessment and treatment plan with the patient. The patient was provided an opportunity to ask questions and all were answered. The patient agreed with the plan and demonstrated an understanding of the instructions.   The patient was advised to call back or seek an in-person evaluation if the symptoms worsen or if the condition fails to improve as anticipated.  I provided  22 minutes of non-face-to-face time during this encounter reviewing patient's current problems and post surgeries.  Providing counseling on the above mentioned problems , and coordination  of care .  Crecencio Mc, MD

## 2019-06-13 NOTE — Assessment & Plan Note (Signed)
Chronic, non radiating Mild, intermittent,  Managed with aleve and tylenol.

## 2019-06-13 NOTE — Progress Notes (Addendum)
Subjective:   Jo Burke is a 85 y.o. female who presents for Medicare Annual (Subsequent) preventive examination.  Review of Systems:  No ROS.  Medicare Wellness Virtual Visit.  Visual/audio telehealth visit, UTA vital signs.   See social history for additional risk factors.   Cardiac Risk Factors include: advanced age (>36men, >68 women)     Objective:     Vitals: There were no vitals taken for this visit.  There is no height or weight on file to calculate BMI.  Advanced Directives 06/13/2019 06/09/2018 05/04/2017  Does Patient Have a Medical Advance Directive? Yes Yes Yes  Type of Advance Directive - [place];Living will Living will;Healthcare Power of [place];Out of facility DNR (pink MOST or yellow form)  Does patient want to make changes to medical advance directive? No - Patient declined No - Patient declined -  Copy of [place] in Chart? - Yes - validated most recent copy scanned in chart (See row information) Yes    Tobacco Social History   Tobacco Use  Smoking Status Former Smoker  Smokeless Tobacco Never Used     Counseling given: Not Answered   Clinical Intake:  Pre-visit preparation completed: Yes        Diabetes: No  How often do you need to have someone help you when you read instructions, pamphlets, or other written materials from your doctor or pharmacy?: 1 - Never  Interpreter Needed?: No     Past Medical History:  Diagnosis Date  . Macular degeneration, left eye 1992   appenzeller,    Past Surgical History:  Procedure Laterality Date  . vocal cord polyp     1962   Family History  Problem Relation Age of Onset  . COPD Father   . Cancer Father        lung  . Cancer Brother 87       liver,   . Cancer Mother 46       Breast   Social History   Socioeconomic History  . Marital status: Widowed    Spouse name: Not on file  . Number of children: Not on file  . Years of education:  Not on file  . Highest education level: Not on file  Occupational History  . Not on file  Social Needs  . Financial resource strain: Not hard at all  . Food insecurity    Worry: Never true    Inability: Never true  . Transportation needs    Medical: No    Non-medical: No  Tobacco Use  . Smoking status: Former Research scientist (life sciences)  . Smokeless tobacco: Never Used  Substance and Sexual Activity  . Alcohol use: Yes    Alcohol/week: 5.0 standard drinks    Types: 5 Glasses of wine per week  . Drug use: No  . Sexual activity: Not Currently  Lifestyle  . Physical activity    Days per week: 5 days    Minutes per session: 30 min  . Stress: Not at all  Relationships  . Social Herbalist on phone: Not on file    Gets together: Not on file    Attends religious service: Not on file    Active member of club or organization: Not on file    Attends meetings of clubs or organizations: Not on file    Relationship status: Widowed  Other Topics Concern  . Not on file  Social History Narrative  . Not on file  Outpatient Encounter Medications as of 06/13/2019  Medication Sig  . Cholecalciferol (VITAMIN D3) 1000 UNITS CAPS Take 1 capsule by mouth.  . Multiple Vitamins-Minerals (PRESERVISION AREDS) CAPS Take 1 capsule by mouth 2 (two) times daily.  . Omega-3 Fatty Acids (FISH OIL) 1000 MG CAPS Take 1 capsule by mouth 2 (two) times daily.  . traZODone (DESYREL) 150 MG tablet Take 150 mg by mouth at bedtime.  . vitamin B-12 (CYANOCOBALAMIN) 1000 MCG tablet Take 1,000 mcg by mouth daily.   No facility-administered encounter medications on file as of 06/13/2019.     Activities of Daily Living In your present state of health, do you have any difficulty performing the following activities: 06/13/2019  Hearing? N  Vision? Y  Difficulty concentrating or making decisions? N  Walking or climbing stairs? Y  Dressing or bathing? N  Doing errands, shopping? Y  Comment She does not Medical illustrator and eating ? Y  Comment She does not stove top cook. Microwave only.  Using the Toilet? N  In the past six months, have you accidently leaked urine? Y  Comment Managed by daily brief  Do you have problems with loss of bowel control? N  Managing your Medications? N  Managing your Finances? Y  Comment Daughter assists as needed  Housekeeping or managing your Housekeeping? Y  Comment Maid assist  Some recent data might be hidden    Patient Care Team: Crecencio Mc, MD as PCP - General (Internal Medicine)    Assessment:   This is a routine wellness examination for Jo Burke.  Nurse connected with patient 06/13/19 at  9:30 AM EST by a telephone enabled telemedicine application and verified that I am speaking with the correct person using two identifiers. Patient stated full name and DOB. Patient gave permission to continue with virtual visit. Patient's location was at home and Nurse's location was at [place] office.   Health Maintenance Due: -Influenza vaccine 2020- declined  -Tdap- discussed; to be completed with doctor in visit or local pharmacy.   -Dexa Scan- deferred for follow up with pcp Update all pending maintenance due as appropriate.   See completed HM at the end of note.   Eye: Visual acuity not assessed. Virtual visit. Wears corrective lenses.   Dental: UTD  Hearing: Demonstrates normal hearing during visit.  Safety:  Patient feels safe at home- yes Patient does have smoke detectors at home- yes Patient does wear sunscreen or protective clothing when in direct sunlight - yes Patient does wear seat belt when in a moving vehicle - yes Patient drives- no Adequate lighting in walkways free from debris- yes Grab bars and handrails used as appropriate- yes Ambulates with no assistive device Cell phone or lifeline/life alert/medic alert on person when ambulating outside of the home- yes  Social: Alcohol intake - yes      Smoking history- former   Smokers in home? none Illicit drug use? none  Depression: PHQ 2 &9 complete. See screening below.  Falls: See screening below.    Medication: Taking as directed and without issues.   Covid-19: Precautions and sickness symptoms discussed. Wears mask, social distancing, hand hygiene as appropriate.   Activities of Daily Living Patient denies needing assistance with: household chores, feeding themselves, getting from bed to chair, getting to the toilet, bathing/showering, dressing, managing money, or preparing meals.   Memory: Patient is alert. Patient denies difficulty focusing or concentrating. Correctly identified the president of the [place], season and recall. Patient likes to  read her kindle for brain stimulation.   BMI- discussed the importance of a healthy diet, water intake and the benefits of aerobic exercise.  Educational material provided.  Physical activity- walking 1.5 miles  Diet:  Regular Water: fair intake  Other Providers Patient Care Team: Crecencio Mc, MD as PCP - General (Internal Medicine)   Exercise Activities and Dietary recommendations Current Exercise Habits: Home exercise routine, Type of exercise: walking, Time (Minutes): 30, Frequency (Times/Week): 4, Weekly Exercise (Minutes/Week): 120, Intensity: Mild  Goals    . Follow up with Primary Care Provider     As needed and I want to play bridge again for brain stimuation       Fall Risk Fall Risk  06/13/2019 06/09/2018 05/04/2017 05/04/2017 03/18/2016  Falls in the past year? 0 1 No No No  Number falls in past yr: 0 1 - - -  Injury with Fall? - 1 - - -  Comment - Fall #2 [place] seat hit her head; sought medical care. Fall#1 missed a step, no injury.  - - -  Risk for fall due to : - History of fall(s) - - -  Follow up Falls prevention discussed;Education provided - - - -  Timed Get Up and Go performed:   Depression Screen PHQ 2/9 Scores 06/13/2019 06/09/2018 05/04/2017 05/04/2017  PHQ - 2  Score 1 0 0 0  PHQ- 9 Score - - 3 3     Cognitive Function     6CIT Screen 06/09/2018 05/04/2017  What Year? 0 points 0 points  What month? 0 points 0 points  What time? 0 points 0 points  Count back from 20 0 points 0 points  Months in reverse 0 points 0 points  Repeat phrase 0 points 0 points  Total Score 0 0    Immunization History  Administered Date(s) Administered  . Influenza Split 06/07/2012  . Pneumococcal Conjugate-13 12/21/2013  . Pneumococcal Polysaccharide-23 12/21/2009, 06/13/2015  . Tdap 10/23/2007  . Zoster 12/22/2007   Screening Tests Health Maintenance  Topic Date Due  . DEXA SCAN  02/03/1995  . TETANUS/TDAP  10/22/2017  . INFLUENZA VACCINE  10/19/2019 (Originally 02/19/2019)  . PNA vac Low Risk Adult  Completed  . MAMMOGRAM  Discontinued      Plan:   Keep all routine maintenance appointments.   Follow up with your doctor today @ 10:00  Medicare Attestation I have personally reviewed: The patient's medical and social history Their use of alcohol, tobacco or illicit drugs Their current medications and supplements The patient's functional ability including ADLs,fall risks, home safety risks, cognitive, and hearing and visual impairment Diet and physical activities Evidence for depression   In addition, I have reviewed and discussed with patient certain preventive protocols, quality metrics, and best practice recommendations. A written personalized care plan for preventive services as well as general preventive health recommendations were provided to patient via mail.     OBrien-Blaney, Nahum Sherrer L, LPN  [phone]    I have reviewed the above information and agree with above.   Deborra Medina, MD

## 2019-06-13 NOTE — Patient Instructions (Addendum)
  Jo Burke , Thank you for taking time to come for your Medicare Wellness Visit. I appreciate your ongoing commitment to your health goals. Please review the following plan we discussed and let me know if I can assist you in the future.   These are the goals we discussed: Goals    . Follow up with Primary Care Provider     As needed and I want to play bridge again for brain stimuation       This is a list of the screening recommended for you and due dates:  Health Maintenance  Topic Date Due  . DEXA scan (bone density measurement)  02/03/1995  . Tetanus Vaccine  10/22/2017  . Flu Shot  10/19/2019*  . Pneumonia vaccines  Completed  . Mammogram  Discontinued  *Topic was postponed. The date shown is not the original due date.

## 2019-06-13 NOTE — Assessment & Plan Note (Signed)
Resolved,  She has gained 6 lbs since her last visit.

## 2019-06-15 ENCOUNTER — Other Ambulatory Visit: Payer: Self-pay

## 2019-07-07 ENCOUNTER — Telehealth: Payer: Self-pay | Admitting: Internal Medicine

## 2019-07-07 NOTE — Telephone Encounter (Signed)
err

## 2019-07-12 ENCOUNTER — Other Ambulatory Visit: Payer: Self-pay

## 2019-07-12 ENCOUNTER — Other Ambulatory Visit: Payer: PPO

## 2019-07-12 ENCOUNTER — Other Ambulatory Visit (INDEPENDENT_AMBULATORY_CARE_PROVIDER_SITE_OTHER): Payer: PPO

## 2019-07-12 DIAGNOSIS — E559 Vitamin D deficiency, unspecified: Secondary | ICD-10-CM

## 2019-07-12 DIAGNOSIS — E538 Deficiency of other specified B group vitamins: Secondary | ICD-10-CM

## 2019-07-13 LAB — CBC WITH DIFFERENTIAL/PLATELET
Basophils Absolute: 0 10*3/uL (ref 0.0–0.1)
Basophils Relative: 0.7 % (ref 0.0–3.0)
Eosinophils Absolute: 0.1 10*3/uL (ref 0.0–0.7)
Eosinophils Relative: 2.2 % (ref 0.0–5.0)
HCT: 42.5 % (ref 36.0–46.0)
Hemoglobin: 13.7 g/dL (ref 12.0–15.0)
Lymphocytes Relative: 22.9 % (ref 12.0–46.0)
Lymphs Abs: 1.4 10*3/uL (ref 0.7–4.0)
MCHC: 32.3 g/dL (ref 30.0–36.0)
MCV: 92.5 fl (ref 78.0–100.0)
Monocytes Absolute: 0.5 10*3/uL (ref 0.1–1.0)
Monocytes Relative: 9.1 % (ref 3.0–12.0)
Neutro Abs: 3.9 10*3/uL (ref 1.4–7.7)
Neutrophils Relative %: 65.1 % (ref 43.0–77.0)
Platelets: 187 10*3/uL (ref 150.0–400.0)
RBC: 4.59 Mil/uL (ref 3.87–5.11)
RDW: 15.1 % (ref 11.5–15.5)
WBC: 6 10*3/uL (ref 4.0–10.5)

## 2019-07-13 LAB — VITAMIN D 25 HYDROXY (VIT D DEFICIENCY, FRACTURES): VITD: 33.59 ng/mL (ref 30.00–100.00)

## 2019-07-13 LAB — VITAMIN B12: Vitamin B-12: 410 pg/mL (ref 211–911)

## 2019-07-15 LAB — FOLATE RBC: RBC Folate: 494 ng/mL RBC (ref >280)

## 2019-07-15 LAB — INTRINSIC FACTOR ANTIBODIES: Intrinsic Factor: NEGATIVE

## 2019-07-20 ENCOUNTER — Other Ambulatory Visit: Payer: PPO

## 2019-07-25 ENCOUNTER — Telehealth: Payer: Self-pay | Admitting: Internal Medicine

## 2019-07-25 NOTE — Telephone Encounter (Signed)
Pt said the office called her last week and was returning phone call.

## 2019-07-27 NOTE — Telephone Encounter (Signed)
Not sure who called no documentation in chart

## 2019-08-08 DIAGNOSIS — H353221 Exudative age-related macular degeneration, left eye, with active choroidal neovascularization: Secondary | ICD-10-CM | POA: Diagnosis not present

## 2019-08-08 DIAGNOSIS — H353211 Exudative age-related macular degeneration, right eye, with active choroidal neovascularization: Secondary | ICD-10-CM | POA: Diagnosis not present

## 2019-08-08 DIAGNOSIS — Z961 Presence of intraocular lens: Secondary | ICD-10-CM | POA: Diagnosis not present

## 2019-09-05 ENCOUNTER — Telehealth: Payer: Self-pay

## 2019-09-05 NOTE — Telephone Encounter (Signed)
LMTCB. Need to find out if pt is still taking Trazodone for sleep.

## 2019-10-17 DIAGNOSIS — H353221 Exudative age-related macular degeneration, left eye, with active choroidal neovascularization: Secondary | ICD-10-CM | POA: Diagnosis not present

## 2019-10-17 DIAGNOSIS — H353211 Exudative age-related macular degeneration, right eye, with active choroidal neovascularization: Secondary | ICD-10-CM | POA: Diagnosis not present

## 2019-11-28 ENCOUNTER — Ambulatory Visit (INDEPENDENT_AMBULATORY_CARE_PROVIDER_SITE_OTHER): Payer: PPO | Admitting: Nurse Practitioner

## 2019-11-28 ENCOUNTER — Encounter: Payer: Self-pay | Admitting: Nurse Practitioner

## 2019-11-28 ENCOUNTER — Telehealth: Payer: Self-pay | Admitting: Nurse Practitioner

## 2019-11-28 ENCOUNTER — Other Ambulatory Visit: Payer: Self-pay

## 2019-11-28 VITALS — BP 130/78 | HR 81 | Temp 97.1°F | Ht 59.0 in | Wt 113.2 lb

## 2019-11-28 DIAGNOSIS — G3184 Mild cognitive impairment, so stated: Secondary | ICD-10-CM | POA: Diagnosis not present

## 2019-11-28 DIAGNOSIS — R300 Dysuria: Secondary | ICD-10-CM

## 2019-11-28 DIAGNOSIS — E559 Vitamin D deficiency, unspecified: Secondary | ICD-10-CM | POA: Diagnosis not present

## 2019-11-28 LAB — URINALYSIS, ROUTINE W REFLEX MICROSCOPIC
Bilirubin Urine: NEGATIVE
Ketones, ur: NEGATIVE
Leukocytes,Ua: NEGATIVE
Nitrite: NEGATIVE
Specific Gravity, Urine: >=1.03 — AB (ref 1.000–1.030)
Total Protein, Urine: NEGATIVE
Urine Glucose: NEGATIVE
Urobilinogen, UA: 0.2 (ref 0.0–1.0)
pH: 5 (ref 5.0–8.0)

## 2019-11-28 LAB — POCT URINALYSIS DIPSTICK
Bilirubin, UA: NEGATIVE
Blood, UA: POSITIVE
Glucose, UA: NEGATIVE
Ketones, UA: NEGATIVE
Leukocytes, UA: NEGATIVE
Nitrite, UA: NEGATIVE
Protein, UA: POSITIVE — AB
Spec Grav, UA: >=1.03 — AB (ref 1.010–1.025)
Urobilinogen, UA: 0.2 E.U./dL
pH, UA: 5.5 (ref 5.0–8.0)

## 2019-11-28 NOTE — Progress Notes (Addendum)
Established Patient Office Visit  Subjective:  Patient ID: Jo Burke, female    DOB: 03/11/1928  Age: 86 y.o. MRN: [id number]  CC:  Chief Complaint  Patient presents with  . Acute Visit    UTI    HPI Jo Burke is an 86 yo patient of Dr. Lupita Dawn with a hx of mild cognitive impairment with memory loss, urinary incontinence in female, nocturia, presents for urinary frequency and urgency noted by her daughter while visiting.   Jo Burke comes in with her daughter Jo Burke, who is her medical power of attorney.  Jo Burke lives in [place], and her mother has been visiting with her. Jo Burke noticed that Jo Burke uses the bathroom very often and has frequent urination and sometimes does not make it in time. She also has worsening memory in the last month.  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     Jo Burke did just have a cognitive test at [facility] to evaluate her short-term memory loss and over the last month, she was confused about what to pack and how to pack to go to her daughter's house. Jo Burke tells me that she is great remembering the past, " I  can't remember what I had for lunch."  "I know my memory is going."  The patient she has also had decrease in her balance, and there is request for physical therapy to be done at [facility].  Jo Burke still lives in independent apartment. She manages her own medications:  B12, fish oil, and PreserVision vitamins.    Jo Burke has also noted some slight bilateral ankle edema in her mother which is new.  The patient denies any chest pain, pressure, shortness of breath, cough, DOE.   Past Medical History:  Diagnosis Date  . Macular degeneration, left eye 1992   appenzeller,     Past Surgical History:  Procedure Laterality Date  . vocal cord polyp     1962    Family History  Problem Relation Age of Onset  . COPD  Father   . Cancer Father        lung  . Cancer Brother 36       liver,   . Cancer Mother 52       Breast    Social History   Socioeconomic History  . Marital status: Widowed    Spouse name: Not on file  . Number of children: Not on file  . Years of education: Not on file  . Highest education level: Not on file  Occupational History  . Not on file  Tobacco Use  . Smoking status: Former Research scientist (life sciences)  . Smokeless tobacco: Never Used  Substance and Sexual Activity  . Alcohol use: Yes    Alcohol/week: 5.0 standard drinks    Types: 5 Glasses of wine per week  . Drug use: No  . Sexual activity: Not Currently  Other Topics Concern  . Not on file  Social History Narrative  . Not on file   Social Determinants of Health   Financial Resource Strain:   . Difficulty of Paying Living Expenses:   Food Insecurity:   . Worried About Charity fundraiser in the Last Year:   . Arboriculturist in the Last Year:   Transportation Needs:   . Film/video editor (Medical):   Marland Kitchen Lack of Transportation (Non-Medical):  Physical Activity:   . Days of Exercise per Week:   . Minutes of Exercise per Session:   Stress:   . Feeling of Stress :   Social Connections:   . Frequency of Communication with Friends and Family:   . Frequency of Social Gatherings with Friends and Family:   . Attends Religious Services:   . Active Member of Clubs or Organizations:   . Attends Archivist Meetings:   Marland Kitchen Marital Status:   Intimate Partner Violence:   . Fear of Current or Ex-Partner:   . Emotionally Abused:   Marland Kitchen Physically Abused:   . Sexually Abused:     Outpatient Medications Prior to Visit  Medication Sig Dispense Refill  . Cholecalciferol (VITAMIN D3) 1000 UNITS CAPS Take 1 capsule by mouth.    . Multiple Vitamins-Minerals (PRESERVISION AREDS) CAPS Take 1 capsule by mouth 2 (two) times daily.    . Omega-3 Fatty Acids (FISH OIL) 1000 MG CAPS Take 1 capsule by mouth 2 (two) times daily.    .  traZODone (DESYREL) 150 MG tablet Take 150 mg by mouth at bedtime.    . vitamin B-12 (CYANOCOBALAMIN) 1000 MCG tablet Take 1,000 mcg by mouth daily.     No facility-administered medications prior to visit.    Allergies  Allergen Reactions  . Alprazolam   . Restoril [Temazepam]   . Sulfa Antibiotics     ROS Pertinent positives none history of present illness otherwise negative.    Objective:    Physical Exam  Constitutional: She is oriented to person, place, and time. She appears well-developed and well-nourished.  HENT:  Head: Normocephalic.  Eyes: Pupils are equal, round, and reactive to light. Conjunctivae are normal.  Cardiovascular: Normal rate and regular rhythm.  Murmur heard. Systolic murmur- maybe flow murmur.   Pulmonary/Chest: Effort normal and breath sounds normal. No respiratory distress. She has no wheezes. She has no rales.  Abdominal: Soft. She exhibits no distension.  Musculoskeletal:        General: Edema present. Normal range of motion.     Cervical back: Normal range of motion and neck supple.     Comments: Slight ankle edema  Neurological: She is alert and oriented to person, place, and time.  Skin: Skin is warm and dry.  Psychiatric: She has a normal mood and affect. Her behavior is normal.  Vitals reviewed.   BP 130/78 (BP Location: Left Arm, Patient Position: Sitting, Cuff Size: Small)   Pulse 81   Temp (!) 97.1 F (36.2 C) (Skin)   Ht 4' 11"  (1.499 m)   Wt 113 lb 3.2 oz (51.3 kg)   SpO2 97%   BMI 22.86 kg/m  Wt Readings from Last 3 Encounters:  11/28/19 113 lb 3.2 oz (51.3 kg)  06/13/19 118 lb (53.5 kg)  01/17/19 112 lb (50.8 kg)     Health Maintenance Due  Topic Date Due  . COVID-19 Vaccine (1) Never done  . DEXA SCAN  Never done  . TETANUS/TDAP  10/22/2017    There are no preventive care reminders to display for this patient.  Lab Results  Component Value Date   TSH 0.63 01/18/2019   Lab Results  Component Value Date    WBC 6.0 07/12/2019   HGB 13.7 07/12/2019   HCT 42.5 07/12/2019   MCV 92.5 07/12/2019   PLT 187.0 07/12/2019   Lab Results  Component Value Date   NA 140 01/18/2019   K 4.5 01/18/2019   CO2 25 01/18/2019  GLUCOSE 87 01/18/2019   BUN 21 01/18/2019   CREATININE 0.91 01/18/2019   BILITOT 0.6 01/18/2019   ALKPHOS 45 01/18/2019   AST 15 01/18/2019   ALT 8 01/18/2019   PROT 6.4 01/18/2019   ALBUMIN 4.0 01/18/2019   CALCIUM 9.6 01/18/2019   GFR 58.21 (L) 01/18/2019   Lab Results  Component Value Date   CHOL 242 (H) 04/26/2018   Lab Results  Component Value Date   HDL 66.40 04/26/2018   Lab Results  Component Value Date   LDLCALC 162 (H) 04/26/2018   Lab Results  Component Value Date   TRIG 67.0 04/26/2018   Lab Results  Component Value Date   CHOLHDL 4 04/26/2018   Lab Results  Component Value Date   HGBA1C 5.4 01/18/2019      Assessment & Plan:   Problem List Items Addressed This Visit      Nervous and Auditory   Mild cognitive impairment with memory loss   Relevant Orders   B12 and Folate Panel   TSH     Other   Vitamin D deficiency   Relevant Orders   VITAMIN D 25 Hydroxy (Vit-D Deficiency, Fractures)   Dysuria - Primary   Relevant Orders   Urine Culture   POCT Urinalysis Dipstick (Completed)   Urinalysis, Routine w reflex microscopic (Completed)   CBC with Differential/Platelet   Comp Met (CMET)      No orders of the defined types were placed in this encounter. For her progressive memory issues, we discussed obtaining a brain MRI today.  The patient declines.  She states she is almost 86 years old and does not want to live forever.  She is not interested in testing.  We discussed a neurology consult  and she prefers to hold off on this as well.    She did agree to routine laboratory studies to look for a physiological cause of worsening memory that may be reversible.  Will check CBC, chemistry, TSH, B12 folate, vitamin D, TSH,  urine studies  to r/o urinary tract infection.  We will call you with results.   Follow-up with Dr. Kalman Jewels in the next week for further evaluation and management.  Addendum: Labs are returning unremarkable. Urine was concentrated and BUN is mildly elevated 28 GFR is still normal, but she needs to hydrate. B12/folate/Vit D,TS WNL. NO obvious UTI. Awaiting urine culture.  Follow-up: Return in about 1 week (around 12/05/2019).   This visit occurred during the SARS-CoV-2 public health emergency.  Safety protocols were in place, including screening questions prior to the visit, additional usage of staff PPE, and extensive cleaning of exam room while observing appropriate contact time as indicated for disinfecting solutions.    Denice Paradise, NP

## 2019-11-28 NOTE — Patient Instructions (Signed)
It was very nice to meet you today.  I have sent off laboratory studies to look at your urine for urinary tract infection.  We will call you with results.  I have also sent off blood work looking for possible reasons why your memory might be worse than normal.  Follow-up with Dr. Kalman Jewels in the next week for further evaluation and management.

## 2019-11-29 LAB — CBC WITH DIFFERENTIAL/PLATELET
Basophils Absolute: 0.1 10*3/uL (ref 0.0–0.1)
Basophils Relative: 1.3 % (ref 0.0–3.0)
Eosinophils Absolute: 0.2 10*3/uL (ref 0.0–0.7)
Eosinophils Relative: 2.7 % (ref 0.0–5.0)
HCT: 39.1 % (ref 36.0–46.0)
Hemoglobin: 12.9 g/dL (ref 12.0–15.0)
Lymphocytes Relative: 17.5 % (ref 12.0–46.0)
Lymphs Abs: 1.1 10*3/uL (ref 0.7–4.0)
MCHC: 33.1 g/dL (ref 30.0–36.0)
MCV: 92 fl (ref 78.0–100.0)
Monocytes Absolute: 0.8 10*3/uL (ref 0.1–1.0)
Monocytes Relative: 12.9 % — ABNORMAL HIGH (ref 3.0–12.0)
Neutro Abs: 4 10*3/uL (ref 1.4–7.7)
Neutrophils Relative %: 65.6 % (ref 43.0–77.0)
Platelets: 220 10*3/uL (ref 150.0–400.0)
RBC: 4.25 Mil/uL (ref 3.87–5.11)
RDW: 14.4 % (ref 11.5–15.5)
WBC: 6.1 10*3/uL (ref 4.0–10.5)

## 2019-11-29 LAB — COMPREHENSIVE METABOLIC PANEL
ALT: 19 U/L (ref 0–35)
AST: 21 U/L (ref 0–37)
Albumin: 3.9 g/dL (ref 3.5–5.2)
Alkaline Phosphatase: 54 U/L (ref 39–117)
BUN: 28 mg/dL — ABNORMAL HIGH (ref 6–23)
CO2: 26 mEq/L (ref 19–32)
Calcium: 9.3 mg/dL (ref 8.4–10.5)
Chloride: 107 mEq/L (ref 96–112)
Creatinine, Ser: 0.86 mg/dL (ref 0.40–1.20)
GFR: 62.02 mL/min (ref >60.00)
Glucose, Bld: 86 mg/dL (ref 70–99)
Potassium: 4.1 mEq/L (ref 3.5–5.1)
Sodium: 141 mEq/L (ref 135–145)
Total Bilirubin: 0.6 mg/dL (ref 0.2–1.2)
Total Protein: 6.4 g/dL (ref 6.0–8.3)

## 2019-11-29 LAB — URINE CULTURE
MICRO NUMBER:: 10458651
SPECIMEN QUALITY:: ADEQUATE

## 2019-11-29 LAB — B12 AND FOLATE PANEL
Folate: 7.6 ng/mL (ref >5.9)
Vitamin B-12: 573 pg/mL (ref 211–911)

## 2019-11-29 LAB — TSH: TSH: 1.02 u[IU]/mL (ref 0.35–4.50)

## 2019-11-29 LAB — VITAMIN D 25 HYDROXY (VIT D DEFICIENCY, FRACTURES): VITD: 34.54 ng/mL (ref 30.00–100.00)

## 2019-11-29 NOTE — Telephone Encounter (Signed)
Add on sheet faxed 

## 2019-11-29 NOTE — Addendum Note (Signed)
Addended by: Leeanne Rio on: 11/29/2019 08:46 AM   Modules accepted: Orders

## 2019-12-05 ENCOUNTER — Encounter: Payer: Self-pay | Admitting: Internal Medicine

## 2019-12-05 ENCOUNTER — Ambulatory Visit (INDEPENDENT_AMBULATORY_CARE_PROVIDER_SITE_OTHER): Payer: PPO | Admitting: Internal Medicine

## 2019-12-05 ENCOUNTER — Other Ambulatory Visit: Payer: Self-pay

## 2019-12-05 VITALS — BP 150/70 | HR 88 | Temp 97.2°F | Resp 16 | Ht 59.0 in | Wt 107.4 lb

## 2019-12-05 DIAGNOSIS — F0150 Vascular dementia without behavioral disturbance: Secondary | ICD-10-CM

## 2019-12-05 DIAGNOSIS — Z111 Encounter for screening for respiratory tuberculosis: Secondary | ICD-10-CM

## 2019-12-05 NOTE — Progress Notes (Addendum)
Subjective:  Patient ID: Jo Burke, female    DOB: July 20, 1928  Age: 86 y.o. MRN: [id number]  CC: The primary encounter diagnosis was Screening for tuberculosis. A diagnosis of Vascular dementia without behavioral disturbance ([place]) was also pertinent to this visit.  HPI Jo Burke presents for follow up on Cognitive changes with memory loss and ongoing weight loss  This visit occurred during the SARS-CoV-2 public health emergency.  Safety protocols were in place, including screening questions prior to the visit, additional usage of staff PPE, and extensive cleaning of exam room while observing appropriate contact time as indicated for disinfecting solutions.   Patient has received both doses of the available COVID 19 vaccine without complications.  Patient continues to mask when outside of the home except when walking in yard or at safe distances from others .  Patient denies any change in mood or development of unhealthy behaviors resuting from the pandemic's restriction of activities and socialization.     86 yr old with MCI  First noticed in Feb 2020 by daughter Jo Burke, presents for follow up on memory impairment and ongoing wt loss (15 lbs in 2 years).  [name] is accompanied by her daughter Jo Burke who lives in [place] who has become very concerned about her mother's decline.  She has had an 11 lb weight loss since November.  Patient states that she eats 2 meals per day.  Uses microwave  For meals.  Does not cook.  Used to go out with friends but not since [place] .  Per daughter she has been forgetting to eat.  Living independently at [facility]  But does not order from [facility].   Daughter has seen a dramatic decline in her memory in the last 2 months.  During her last planned visit to daughter's house.  Patient could not pack a suitcase .  Some urge incontinence , trouble navigating daughter's hourse (which she has been coming for years)  Balance is not good,  Because of her declining  vision she is afraid to walk outside.   Buys her the food,  inventories the food and nothing is being eaten. Majority of diet is soup, sweets and potato chips .  Will not drink Boost.  Eats voraciously when at daughter's house.   Social work recently did an evaluation and performed an MMSE  and she scored 16/30 .  PT and OT evaluation in progress  Patient is not looking forward to leaving her home for a smaller assisted living residence due to her emotional  attachment to her furniture and "beautiful things."   Outpatient Medications Prior to Visit  Medication Sig Dispense Refill  . Cholecalciferol (VITAMIN D3) 1000 UNITS CAPS Take 1 capsule by mouth.    . Multiple Vitamins-Minerals (PRESERVISION AREDS) CAPS Take 1 capsule by mouth 2 (two) times daily.    . Omega-3 Fatty Acids (FISH OIL) 1000 MG CAPS Take 1 capsule by mouth 2 (two) times daily.    . traZODone (DESYREL) 150 MG tablet Take 150 mg by mouth at bedtime.    . vitamin B-12 (CYANOCOBALAMIN) 1000 MCG tablet Take 1,000 mcg by mouth daily.     No facility-administered medications prior to visit.    Review of Systems;  Patient denies headache, fevers, malaise, unintentional weight loss, skin rash, eye pain, sinus congestion and sinus pain, sore throat, dysphagia,  hemoptysis , cough, dyspnea, wheezing, chest pain, palpitations, orthopnea, edema, abdominal pain, nausea, melena, diarrhea, constipation, flank pain, dysuria, hematuria, urinary  Frequency,  nocturia, numbness, tingling, seizures,  Focal weakness, Loss of consciousness,  Tremor, insomnia, depression, anxiety, and suicidal ideation.      Objective:  BP (!) 150/70 (BP Location: Left Arm, Patient Position: Sitting, Cuff Size: Normal)   Pulse 88   Temp (!) 97.2 F (36.2 C) (Temporal)   Resp 16   Ht 4\' 11"  (1.499 m)   Wt 107 lb 6.4 oz (48.7 kg)   SpO2 99%   BMI 21.69 kg/m   BP Readings from Last 3 Encounters:  12/05/19 (!) 150/70  11/28/19 130/78  06/09/18 130/70     Wt Readings from Last 3 Encounters:  12/05/19 107 lb 6.4 oz (48.7 kg)  11/28/19 113 lb 3.2 oz (51.3 kg)  06/13/19 118 lb (53.5 kg)    General appearance: alert, cooperative and appears stated age.  Weight loss evident Ears: normal TM's and external ear canals both ears Throat: lips, mucosa, and tongue normal; teeth and gums normal Neck: no adenopathy, no carotid bruit, supple, symmetrical, trachea midline and thyroid not enlarged, symmetric, no tenderness/mass/nodules Back: symmetric, no curvature. ROM normal. No CVA tenderness. Lungs: clear to auscultation bilaterally Heart: regular rate and rhythm, S1, S2 normal, no murmur, click, rub or gallop Abdomen: soft, non-tender; bowel sounds normal; no masses,  no organomegaly Pulses: 2+ and symmetric Skin: Skin color, texture, turgor normal. No rashes or lesions Lymph nodes: Cervical, supraclavicular, and axillary nodes normal.  Lab Results  Component Value Date   HGBA1C 5.4 01/18/2019   HGBA1C 5.3 04/26/2018    Lab Results  Component Value Date   CREATININE 0.86 11/28/2019   CREATININE 0.91 01/18/2019   CREATININE 0.93 06/02/2018    Lab Results  Component Value Date   WBC 6.1 11/28/2019   HGB 12.9 11/28/2019   HCT 39.1 11/28/2019   PLT 220.0 11/28/2019   GLUCOSE 86 11/28/2019   CHOL 242 (H) 04/26/2018   TRIG 67.0 04/26/2018   HDL 66.40 04/26/2018   LDLDIRECT 159.0 05/01/2017   LDLCALC 162 (H) 04/26/2018   ALT 19 11/28/2019   AST 21 11/28/2019   NA 141 11/28/2019   K 4.1 11/28/2019   CL 107 11/28/2019   CREATININE 0.86 11/28/2019   BUN 28 (H) 11/28/2019   CO2 26 11/28/2019   TSH 1.02 11/29/2019   HGBA1C 5.4 01/18/2019   MICROALBUR 1.8 08/01/2011     Assessment & Plan:   Problem List Items Addressed This Visit      Unprioritized   Dementia [facility])    Vascular,  Based on ischemic changes noted on Nov 2019 MRI.  Cognitive decline was first noted by family in February and has been progressive since July 2020  per family.  Recent MMSE was done by Social Work  And she scored  16/30 . She is unable to manage her medications,  Meals,  And has alrady stopped playing bridge due to loss of executive function .  She has lost a dramatic amount of weight due to neglecting to eat.  Agree with transition to A/L to avoid health decline. PPD placed today . RTC Wednesday,  Will also give Shingrx vaccine 1 of 2 doses.        Other Visit Diagnoses    Screening for tuberculosis    -  Primary   Relevant Orders   PPD (Completed)     A total of 40 minutes was spent with patient more than half of which was spent in counseling patient on the above mentioned issues , reviewing and explaining recent  labs and imaging studies done, and coordination of care.   I am having Mc Gentil maintain her PreserVision AREDS, Fish Oil, Vitamin D3, traZODone, and vitamin B-12.  No orders of the defined types were placed in this encounter.   There are no discontinued medications.  Follow-up: No follow-ups on file.   Crecencio Mc, MD

## 2019-12-05 NOTE — Assessment & Plan Note (Addendum)
Vascular,  Based on ischemic changes noted on Nov 2019 MRI.  Cognitive decline was first noted by family in February and has been progressive since July 2020 per family.  Recent MMSE was done by Social Work  And she scored  16/30 . She is unable to manage her medications,  Meals,  And has alrady stopped playing bridge due to loss of executive function .  She has lost a dramatic amount of weight due to neglecting to eat.  Agree with transition to A/L to avoid health decline. PPD placed today . RTC Wednesday,  Will also give Shingrx vaccine 1 of 2 doses.

## 2019-12-05 NOTE — Patient Instructions (Signed)
Ms Jo Burke,  I am very worried about your decline and I agree with [name] that it is time for you to have more help than you get have living in your home  I strongly recommend that you move into an Assisted Living situation to protect your health   Return on Wednesday to have your skin test read and your fist Shingles vaccine

## 2019-12-07 ENCOUNTER — Other Ambulatory Visit: Payer: Self-pay

## 2019-12-07 ENCOUNTER — Encounter: Payer: Self-pay | Admitting: Internal Medicine

## 2019-12-07 ENCOUNTER — Ambulatory Visit (INDEPENDENT_AMBULATORY_CARE_PROVIDER_SITE_OTHER): Payer: PPO

## 2019-12-07 DIAGNOSIS — Z23 Encounter for immunization: Secondary | ICD-10-CM | POA: Diagnosis not present

## 2019-12-07 DIAGNOSIS — Z111 Encounter for screening for respiratory tuberculosis: Secondary | ICD-10-CM

## 2019-12-07 LAB — TB SKIN TEST
Induration: 0 mm
TB Skin Test: NEGATIVE

## 2019-12-07 NOTE — Progress Notes (Signed)
Patient presented for shingrix injection to left deltoid, patient voiced no concerns nor showed any signs of distress during injection. PPD skin test was also read.

## 2019-12-15 DIAGNOSIS — R262 Difficulty in walking, not elsewhere classified: Secondary | ICD-10-CM | POA: Diagnosis not present

## 2019-12-15 DIAGNOSIS — R2681 Unsteadiness on feet: Secondary | ICD-10-CM | POA: Diagnosis not present

## 2019-12-20 DIAGNOSIS — R262 Difficulty in walking, not elsewhere classified: Secondary | ICD-10-CM | POA: Diagnosis not present

## 2019-12-20 DIAGNOSIS — R2681 Unsteadiness on feet: Secondary | ICD-10-CM | POA: Diagnosis not present

## 2020-01-03 ENCOUNTER — Telehealth: Payer: Self-pay | Admitting: Internal Medicine

## 2020-01-03 NOTE — Telephone Encounter (Signed)
Printed and faxed

## 2020-01-03 NOTE — Telephone Encounter (Signed)
Pt daughter called and [facility] is needing pt last 3 offices visits faxed to Chrystie Nose at [phone]

## 2020-01-11 DIAGNOSIS — Z0279 Encounter for issue of other medical certificate: Secondary | ICD-10-CM

## 2020-01-16 DIAGNOSIS — H353221 Exudative age-related macular degeneration, left eye, with active choroidal neovascularization: Secondary | ICD-10-CM | POA: Diagnosis not present

## 2020-01-16 DIAGNOSIS — H353211 Exudative age-related macular degeneration, right eye, with active choroidal neovascularization: Secondary | ICD-10-CM | POA: Diagnosis not present

## 2020-01-19 DIAGNOSIS — M5440 Lumbago with sciatica, unspecified side: Secondary | ICD-10-CM | POA: Diagnosis not present

## 2020-01-19 DIAGNOSIS — E559 Vitamin D deficiency, unspecified: Secondary | ICD-10-CM | POA: Diagnosis not present

## 2020-01-19 DIAGNOSIS — Z741 Need for assistance with personal care: Secondary | ICD-10-CM | POA: Diagnosis not present

## 2020-01-19 DIAGNOSIS — H3530 Unspecified macular degeneration: Secondary | ICD-10-CM | POA: Diagnosis not present

## 2020-01-19 DIAGNOSIS — R262 Difficulty in walking, not elsewhere classified: Secondary | ICD-10-CM | POA: Diagnosis not present

## 2020-01-19 DIAGNOSIS — D519 Vitamin B12 deficiency anemia, unspecified: Secondary | ICD-10-CM | POA: Diagnosis not present

## 2020-01-19 DIAGNOSIS — R278 Other lack of coordination: Secondary | ICD-10-CM | POA: Diagnosis not present

## 2020-01-19 DIAGNOSIS — R4189 Other symptoms and signs involving cognitive functions and awareness: Secondary | ICD-10-CM | POA: Diagnosis not present

## 2020-01-19 DIAGNOSIS — R2681 Unsteadiness on feet: Secondary | ICD-10-CM | POA: Diagnosis not present

## 2020-02-20 DIAGNOSIS — R278 Other lack of coordination: Secondary | ICD-10-CM | POA: Diagnosis not present

## 2020-02-20 DIAGNOSIS — Z741 Need for assistance with personal care: Secondary | ICD-10-CM | POA: Diagnosis not present

## 2020-02-20 DIAGNOSIS — E559 Vitamin D deficiency, unspecified: Secondary | ICD-10-CM | POA: Diagnosis not present

## 2020-02-20 DIAGNOSIS — D519 Vitamin B12 deficiency anemia, unspecified: Secondary | ICD-10-CM | POA: Diagnosis not present

## 2020-02-20 DIAGNOSIS — H3530 Unspecified macular degeneration: Secondary | ICD-10-CM | POA: Diagnosis not present

## 2020-02-20 DIAGNOSIS — R262 Difficulty in walking, not elsewhere classified: Secondary | ICD-10-CM | POA: Diagnosis not present

## 2020-02-20 DIAGNOSIS — R4189 Other symptoms and signs involving cognitive functions and awareness: Secondary | ICD-10-CM | POA: Diagnosis not present

## 2020-02-20 DIAGNOSIS — R2681 Unsteadiness on feet: Secondary | ICD-10-CM | POA: Diagnosis not present

## 2020-02-20 DIAGNOSIS — M5440 Lumbago with sciatica, unspecified side: Secondary | ICD-10-CM | POA: Diagnosis not present

## 2020-02-29 ENCOUNTER — Ambulatory Visit (INDEPENDENT_AMBULATORY_CARE_PROVIDER_SITE_OTHER): Payer: PPO

## 2020-02-29 ENCOUNTER — Other Ambulatory Visit: Payer: Self-pay

## 2020-02-29 DIAGNOSIS — Z23 Encounter for immunization: Secondary | ICD-10-CM | POA: Diagnosis not present

## 2020-02-29 NOTE — Progress Notes (Signed)
Patient presented for Shingrix injection to left deltoid, patient voiced no concerns nor showed any signs of distress during injection.

## 2020-03-21 DIAGNOSIS — R2681 Unsteadiness on feet: Secondary | ICD-10-CM | POA: Diagnosis not present

## 2020-03-21 DIAGNOSIS — Z741 Need for assistance with personal care: Secondary | ICD-10-CM | POA: Diagnosis not present

## 2020-03-21 DIAGNOSIS — D519 Vitamin B12 deficiency anemia, unspecified: Secondary | ICD-10-CM | POA: Diagnosis not present

## 2020-03-21 DIAGNOSIS — R262 Difficulty in walking, not elsewhere classified: Secondary | ICD-10-CM | POA: Diagnosis not present

## 2020-03-21 DIAGNOSIS — M5440 Lumbago with sciatica, unspecified side: Secondary | ICD-10-CM | POA: Diagnosis not present

## 2020-03-21 DIAGNOSIS — E559 Vitamin D deficiency, unspecified: Secondary | ICD-10-CM | POA: Diagnosis not present

## 2020-03-21 DIAGNOSIS — H3530 Unspecified macular degeneration: Secondary | ICD-10-CM | POA: Diagnosis not present

## 2020-03-21 DIAGNOSIS — R278 Other lack of coordination: Secondary | ICD-10-CM | POA: Diagnosis not present

## 2020-03-21 DIAGNOSIS — R4189 Other symptoms and signs involving cognitive functions and awareness: Secondary | ICD-10-CM | POA: Diagnosis not present

## 2020-03-22 ENCOUNTER — Telehealth: Payer: Self-pay | Admitting: Internal Medicine

## 2020-03-22 NOTE — Telephone Encounter (Signed)
Letter drafted and printed from home

## 2020-03-22 NOTE — Telephone Encounter (Signed)
Patient 's daughter called in wanted a order for advil for leg pain for patient did not have the place where the order needs to be sent will call back with where the order need to be sent to.

## 2020-03-22 NOTE — Telephone Encounter (Signed)
[facility] called their Fax machine is down and needs the order sent to  [facility] fax # [phone]

## 2020-03-23 NOTE — Telephone Encounter (Signed)
Letter has been faxed.

## 2020-04-20 DIAGNOSIS — R4189 Other symptoms and signs involving cognitive functions and awareness: Secondary | ICD-10-CM | POA: Diagnosis not present

## 2020-04-20 DIAGNOSIS — R278 Other lack of coordination: Secondary | ICD-10-CM | POA: Diagnosis not present

## 2020-04-20 DIAGNOSIS — R2681 Unsteadiness on feet: Secondary | ICD-10-CM | POA: Diagnosis not present

## 2020-04-20 DIAGNOSIS — Z741 Need for assistance with personal care: Secondary | ICD-10-CM | POA: Diagnosis not present

## 2020-04-20 DIAGNOSIS — R262 Difficulty in walking, not elsewhere classified: Secondary | ICD-10-CM | POA: Diagnosis not present

## 2020-04-20 DIAGNOSIS — D519 Vitamin B12 deficiency anemia, unspecified: Secondary | ICD-10-CM | POA: Diagnosis not present

## 2020-04-20 DIAGNOSIS — H3530 Unspecified macular degeneration: Secondary | ICD-10-CM | POA: Diagnosis not present

## 2020-04-20 DIAGNOSIS — M5440 Lumbago with sciatica, unspecified side: Secondary | ICD-10-CM | POA: Diagnosis not present

## 2020-04-20 DIAGNOSIS — E559 Vitamin D deficiency, unspecified: Secondary | ICD-10-CM | POA: Diagnosis not present

## 2020-04-23 DIAGNOSIS — H353222 Exudative age-related macular degeneration, left eye, with inactive choroidal neovascularization: Secondary | ICD-10-CM | POA: Diagnosis not present

## 2020-04-23 DIAGNOSIS — H353211 Exudative age-related macular degeneration, right eye, with active choroidal neovascularization: Secondary | ICD-10-CM | POA: Diagnosis not present

## 2020-04-23 DIAGNOSIS — H353221 Exudative age-related macular degeneration, left eye, with active choroidal neovascularization: Secondary | ICD-10-CM | POA: Diagnosis not present

## 2020-04-30 ENCOUNTER — Telehealth: Payer: Self-pay | Admitting: Internal Medicine

## 2020-04-30 DIAGNOSIS — Z0279 Encounter for issue of other medical certificate: Secondary | ICD-10-CM

## 2020-04-30 NOTE — Telephone Encounter (Signed)
The form from [employer] has been completed.  Th charge is $50. [employer]

## 2020-04-30 NOTE — Telephone Encounter (Signed)
Form has been faxed and charge has been placed up front in charge folder.

## 2020-06-06 ENCOUNTER — Telehealth: Payer: Self-pay | Admitting: Internal Medicine

## 2020-06-06 NOTE — Telephone Encounter (Signed)
Noted  

## 2020-06-06 NOTE — Telephone Encounter (Signed)
The patient's daughter returned the call to inform us that her mothers cognitive skills are going down and there will be no need to have an AWV. Appointment has been canceled.

## 2020-06-12 ENCOUNTER — Encounter: Payer: Self-pay | Admitting: Internal Medicine

## 2020-06-12 ENCOUNTER — Other Ambulatory Visit: Payer: Self-pay

## 2020-06-12 ENCOUNTER — Ambulatory Visit (INDEPENDENT_AMBULATORY_CARE_PROVIDER_SITE_OTHER): Payer: PPO | Admitting: Internal Medicine

## 2020-06-12 VITALS — BP 170/80 | HR 75 | Temp 98.0°F | Resp 16 | Ht 59.0 in | Wt 116.6 lb

## 2020-06-12 DIAGNOSIS — R200 Anesthesia of skin: Secondary | ICD-10-CM | POA: Diagnosis not present

## 2020-06-12 DIAGNOSIS — R030 Elevated blood-pressure reading, without diagnosis of hypertension: Secondary | ICD-10-CM

## 2020-06-12 DIAGNOSIS — R609 Edema, unspecified: Secondary | ICD-10-CM

## 2020-06-12 DIAGNOSIS — F0150 Vascular dementia without behavioral disturbance: Secondary | ICD-10-CM

## 2020-06-12 DIAGNOSIS — R202 Paresthesia of skin: Secondary | ICD-10-CM

## 2020-06-12 DIAGNOSIS — E538 Deficiency of other specified B group vitamins: Secondary | ICD-10-CM | POA: Diagnosis not present

## 2020-06-12 NOTE — Progress Notes (Signed)
Subjective:  Patient ID: Jo Burke, female    DOB: [birth year]  Age: 86 y.o. MRN: [id number]  CC: The primary encounter diagnosis was Numbness and tingling of both legs. Diagnoses of B12 deficiency, Vascular dementia without behavioral disturbance ([place]), Elevated blood pressure reading in office without diagnosis of hypertension, and Edema, unspecified type were also pertinent to this visit.  HPI Jo Burke presents for EVALUATION OF WORSENING LEG EDEMA.  She is brought in by her daughter nancy  This visit occurred during the SARS-CoV-2 public health emergency.  Safety protocols were in place, including screening questions prior to the visit, additional usage of staff PPE, and extensive cleaning of exam room while observing appropriate contact time as indicated for disinfecting solutions.   Jo Burke is a 86 yr old resident of [facility] who was diagnosed with vascular dementia in July 2020 .  She has limited vision due to macular degeneration in her left eye and has been living independently .  Per her daughter , her dementia has progressed and she is being moved to [facility] in a few weeks  Patient denies dyspnea, orthopnea, and chest pain.  She is living a sedentary lifestyle. Not taking any NSAIDS, calcium channel blockers or neuroleptics.   She does note numbness of both lower extremities ,  To mid tibia. She is not sure how long the symptoms have been present    Outpatient Medications Prior to Visit  Medication Sig Dispense Refill  . acetaminophen (TYLENOL) 650 MG CR tablet Take 650 mg by mouth every 8 (eight) hours as needed for pain.    . Cholecalciferol (VITAMIN D3) 1000 UNITS CAPS Take 1 capsule by mouth.    . Multiple Vitamins-Minerals (PRESERVISION AREDS) CAPS Take 1 capsule by mouth 2 (two) times daily.    . Omega-3 Fatty Acids (FISH OIL) 1000 MG CAPS Take 1 capsule by mouth 2 (two) times daily.    . vitamin B-12 (CYANOCOBALAMIN) 1000 MCG tablet Take 1,000 mcg by mouth  daily.    . traZODone (DESYREL) 150 MG tablet Take 150 mg by mouth at bedtime. (Patient not taking: Reported on 06/12/2020)     No facility-administered medications prior to visit.    Review of Systems;  Patient denies headache, fevers, malaise, unintentional weight loss, skin rash, eye pain, sinus congestion and sinus pain, sore throat, dysphagia,  hemoptysis , cough, dyspnea, wheezing, chest pain, palpitations, orthopnea, edema, abdominal pain, nausea, melena, diarrhea, constipation, flank pain, dysuria, hematuria, urinary  Frequency, nocturia,tingling, seizures,  Focal weakness, Loss of consciousness,  Tremor, insomnia, depression, anxiety, and suicidal ideation.      Objective:  BP (!) 170/80 (BP Location: Left Arm, Patient Position: Sitting, Cuff Size: Normal)   Pulse 75   Temp 98 F (36.7 C) (Oral)   Resp 16   Ht 4\' 11"  (1.499 m)   Wt 116 lb 9.6 oz (52.9 kg)   SpO2 98%   BMI 23.55 kg/m   BP Readings from Last 3 Encounters:  06/12/20 (!) 170/80  12/05/19 (!) 150/70  11/28/19 130/78    Wt Readings from Last 3 Encounters:  06/12/20 116 lb 9.6 oz (52.9 kg)  12/05/19 107 lb 6.4 oz (48.7 kg)  11/28/19 113 lb 3.2 oz (51.3 kg)    General appearance: alert, cooperative and appears stated age Ears: normal TM's and external ear canals both ears Throat: lips, mucosa, and tongue normal; teeth and gums normal Neck: no adenopathy, no carotid bruit, supple, symmetrical, trachea midline and thyroid not enlarged, symmetric,  no tenderness/mass/nodules Back: symmetric, no curvature. ROM normal. No CVA tenderness. Lungs: clear to auscultation bilaterally Heart: regular rate and rhythm, S1, S2 normal, no murmur, click, rub or gallop Abdomen: soft, non-tender; bowel sounds normal; no masses,  no organomegaly Pulses: 2+ and symmetric Skin: Skin color, texture, turgor normal. No rashes or lesions.  Mild non pitting edema  Lymph nodes: Cervical, supraclavicular, and axillary nodes  normal. Foot exam:   No callouses,  Sensation NOT intact to microfilament until mid tibia   Lab Results  Component Value Date   HGBA1C 5.1 06/12/2020   HGBA1C 5.4 01/18/2019   HGBA1C 5.3 04/26/2018    Lab Results  Component Value Date   CREATININE 1.04 06/12/2020   CREATININE 0.86 11/28/2019   CREATININE 0.91 01/18/2019    Lab Results  Component Value Date   WBC 6.1 11/28/2019   HGB 12.9 11/28/2019   HCT 39.1 11/28/2019   PLT 220.0 11/28/2019   GLUCOSE 87 06/12/2020   CHOL 242 (H) 04/26/2018   TRIG 67.0 04/26/2018   HDL 66.40 04/26/2018   LDLDIRECT 159.0 05/01/2017   LDLCALC 162 (H) 04/26/2018   ALT 34 06/12/2020   AST 24 06/12/2020   NA 140 06/12/2020   K 4.5 06/12/2020   CL 105 06/12/2020   CREATININE 1.04 06/12/2020   BUN 25 (H) 06/12/2020   CO2 29 06/12/2020   TSH 0.72 06/12/2020   HGBA1C 5.1 06/12/2020   MICROALBUR 1.8 08/01/2011    CT Head Wo Contrast  Result Date: 06/02/2018 CLINICAL DATA:  Pt states she hit the back of her head due to a fall by missing the chair she was sitting down in on Saturday 05/29/2018. Pt c/o posterior H/A's and unsteady gait. No hx CA. No hx cranial surg, CVA, brain aneurysm or seizures. EXAM: CT HEAD WITHOUT CONTRAST TECHNIQUE: Contiguous axial images were obtained from the base of the skull through the vertex without intravenous contrast. COMPARISON:  None. FINDINGS: Brain: No evidence of acute infarction, hemorrhage, hydrocephalus, extra-axial collection or mass lesion/mass effect. There is ventricular sulcal enlargement reflecting age-appropriate volume loss. Mild periventricular white matter hypoattenuation is also present consistent with chronic microvascular ischemic change. Vascular: No hyperdense vessel or unexpected calcification. Skull: Normal. Negative for fracture or focal lesion. Sinuses/Orbits: Visualize globes and orbits are unremarkable. The visualized sinuses and mastoid air cells are clear. Other: None. IMPRESSION: 1. No  acute intracranial abnormalities. 2. Age-appropriate volume loss and mild chronic microvascular ischemic change. Electronically Signed   By: Lajean Manes M.D.   On: 06/02/2018 11:23    Assessment & Plan:   Problem List Items Addressed This Visit      Unprioritized   Numbness and tingling of both legs - Primary    Patient describing symptoms suggestive of neuropathy.  Screening labs were negative      Relevant Orders   Hemoglobin A1c (Completed)   TSH (Completed)   RPR (Completed)   Comprehensive metabolic panel (Completed)   Elevated blood pressure reading in office without diagnosis of hypertension    Will have [facility] Care measure blood pressure and begin medications if elevated at home.       Edema    Likely due to venous insufficiency given lack of cardiac symptoms except for elevated blood pressure.  If readings are duplicated at Memory care will begin diuretic as BP medication choice.       Dementia ([place])    Her cognitive decline continues and she now needs increased supervision and assistance.  Agree with  move to Memory care       B12 deficiency   Relevant Orders   Vitamin B12 (Completed)     I provided  30 minutes of  face-to-face time during this encounter reviewing patient's current problems and past surgeries, labs and imaging studies, providing counseling on the above mentioned problems , and coordination  of care .  I have discontinued Mistey Butler's traZODone. I am also having her maintain her PreserVision AREDS, Fish Oil, Vitamin D3, vitamin B-12, and acetaminophen.  No orders of the defined types were placed in this encounter.   Medications Discontinued During This Encounter  Medication Reason  . traZODone (DESYREL) 150 MG tablet     Follow-up: Return in about 6 months (around 12/10/2020).   Crecencio Mc, MD

## 2020-06-12 NOTE — Patient Instructions (Signed)
You are doing well!  The swelling in your legs can be managed with LEG ELEVATION WHEN SITTING   YOUR FEET ARE NUMB SO PROTECT THEM WITH SHOES/SLIPPERS

## 2020-06-13 ENCOUNTER — Ambulatory Visit: Payer: PPO

## 2020-06-13 ENCOUNTER — Other Ambulatory Visit: Payer: Self-pay | Admitting: Internal Medicine

## 2020-06-13 DIAGNOSIS — R944 Abnormal results of kidney function studies: Secondary | ICD-10-CM

## 2020-06-13 LAB — COMPREHENSIVE METABOLIC PANEL
ALT: 34 U/L (ref 0–35)
AST: 24 U/L (ref 0–37)
Albumin: 4 g/dL (ref 3.5–5.2)
Alkaline Phosphatase: 72 U/L (ref 39–117)
BUN: 25 mg/dL — ABNORMAL HIGH (ref 6–23)
CO2: 29 mEq/L (ref 19–32)
Calcium: 9.7 mg/dL (ref 8.4–10.5)
Chloride: 105 mEq/L (ref 96–112)
Creatinine, Ser: 1.04 mg/dL (ref 0.40–1.20)
GFR: 47.37 mL/min — ABNORMAL LOW (ref >60.00)
Glucose, Bld: 87 mg/dL (ref 70–99)
Potassium: 4.5 mEq/L (ref 3.5–5.1)
Sodium: 140 mEq/L (ref 135–145)
Total Bilirubin: 0.5 mg/dL (ref 0.2–1.2)
Total Protein: 6.3 g/dL (ref 6.0–8.3)

## 2020-06-13 LAB — HEMOGLOBIN A1C: Hgb A1c MFr Bld: 5.1 % (ref 4.6–6.5)

## 2020-06-13 LAB — VITAMIN B12: Vitamin B-12: 1456 pg/mL — ABNORMAL HIGH (ref 211–911)

## 2020-06-13 LAB — RPR: RPR Ser Ql: NONREACTIVE

## 2020-06-13 LAB — TSH: TSH: 0.72 u[IU]/mL (ref 0.35–4.50)

## 2020-06-13 NOTE — Progress Notes (Signed)
Your screening labs for reversible conditions  that could be causing your leg numbness Including diabetes, B12 deficiency, thyroid dysfunction, were all negative/normal.  Continue your B12 injections .  Your creatinine was a little elevated.  This may be due to mild dehydration.  Please try to drink at least  60 ounces if water or non caffeinated beverages daily .  We should repeat the test in 3 months   Regards,   Deborra Medina, MD

## 2020-06-15 DIAGNOSIS — R202 Paresthesia of skin: Secondary | ICD-10-CM | POA: Insufficient documentation

## 2020-06-15 DIAGNOSIS — R200 Anesthesia of skin: Secondary | ICD-10-CM | POA: Insufficient documentation

## 2020-06-15 DIAGNOSIS — R609 Edema, unspecified: Secondary | ICD-10-CM | POA: Insufficient documentation

## 2020-06-15 DIAGNOSIS — R030 Elevated blood-pressure reading, without diagnosis of hypertension: Secondary | ICD-10-CM | POA: Insufficient documentation

## 2020-06-15 NOTE — Assessment & Plan Note (Signed)
Likely due to venous insufficiency given lack of cardiac symptoms except for elevated blood pressure.  If readings are duplicated at Memory care will begin diuretic as BP medication choice.

## 2020-06-15 NOTE — Assessment & Plan Note (Signed)
Patient describing symptoms suggestive of neuropathy.  Screening labs were negative

## 2020-06-15 NOTE — Assessment & Plan Note (Signed)
Will have Memory Care measure blood pressure and begin medications if elevated at home.

## 2020-06-15 NOTE — Assessment & Plan Note (Signed)
Her cognitive decline continues and she now needs increased supervision and assistance.  Agree with move to Memory care

## 2020-06-20 ENCOUNTER — Telehealth: Payer: Self-pay | Admitting: Internal Medicine

## 2020-06-20 NOTE — Telephone Encounter (Signed)
Patient's daughter, [name] called. She would like to bring mother into office to see Dr. Derrel Nip. Patient had two episodes where the daughter could not wake patient up. EMT's were called in and they checked her out and vital signs were good. The caregivers are also seeing signs of depression. When could Dr. Derrel Nip see her or have a virtual with patient.

## 2020-06-20 NOTE — Telephone Encounter (Signed)
Spoke with pt's daughter to let her know that the nursing home reached out to [place] wanting to see if Dr. Derrel Nip would prescribe the pt an antidepressant and we advised them that pt would need a virtual visit to discuss. Also let the daughter know that the visit has been scheduled for 06/22/2020 with the facility. Daughter gave a verbal understanding.

## 2020-06-22 ENCOUNTER — Encounter: Payer: Self-pay | Admitting: Internal Medicine

## 2020-06-22 ENCOUNTER — Other Ambulatory Visit: Payer: Self-pay

## 2020-06-22 ENCOUNTER — Telehealth (INDEPENDENT_AMBULATORY_CARE_PROVIDER_SITE_OTHER): Payer: PPO | Admitting: Internal Medicine

## 2020-06-22 DIAGNOSIS — F320 Major depressive disorder, single episode, mild: Secondary | ICD-10-CM | POA: Diagnosis not present

## 2020-06-22 MED ORDER — MIRTAZAPINE 7.5 MG PO TABS: 7.5000 mg | ORAL_TABLET | Freq: Every day | ORAL | 1 refills | Status: AC

## 2020-06-22 NOTE — Progress Notes (Signed)
Virtual Visit via [place]  This visit type was conducted due to national recommendations for restrictions regarding the COVID-19 pandemic (e.g. social distancing).  This format is felt to be most appropriate for this patient at this time.  All issues noted in this document were discussed and addressed.  No physical exam was performed (except for noted visual exam findings with Video Visits).   I connected with@ on 06/22/20 at 11:00 AM EST by a video enabled telemedicine application  and verified that I am speaking with the correct person using two identifiers. Location patient: home Location provider: work or home office Persons participating in the virtual visit: patient, provider  I discussed the limitations, risks, security and privacy concerns of performing an evaluation and management service by telephone and the availability of in person appointments. I also discussed with the patient that there may be a patient responsible charge related to this service. The patient expressed understanding and agreed to proceed.  Reason for visit:   positive screen for depression  Per staff  HPI:  86 yr old female with macular degeneration, chronic insomnia ,  New onset cognitive decline now a  resident of [facility] presents with depression ."I'm not contirbuting anything."  I'm ready to die  , I keep asking God to take me  Chronic insomnia,  Wants to spend the day in bed,  Not participating in activities or leaving room voluntarily to eat,  but once taken to dinign hall she will eat.  Family and staff have witnessed periods of unresponsiveness lasting up to 30 minutes,  Eyes open but cannot speak.  She reports that she is aware of these epsiodes but feels paralyzed.    ROS: See pertinent positives and negatives per HPI.  Past Medical History:  Diagnosis Date  . Macular degeneration, left eye 1992   [place],     Past Surgical History:  Procedure Laterality Date  . vocal cord  polyp     1962    Family History  Problem Relation Age of Onset  . COPD Father   . Cancer Father        lung  . Cancer Brother 67       liver,   . Cancer Mother 28       Breast    SOCIAL HX:  reports that she has quit smoking. She has never used smokeless tobacco. She reports current alcohol use of about 5.0 standard drinks of alcohol per week. She reports that she does not use drugs.   Current Outpatient Medications:  .  acetaminophen (TYLENOL) 650 MG CR tablet, Take 650 mg by mouth every 8 (eight) hours as needed for pain., Disp: , Rfl:  .  Cholecalciferol (VITAMIN D3) 1000 UNITS CAPS, Take 1 capsule by mouth., Disp: , Rfl:  .  Multiple Vitamins-Minerals (PRESERVISION AREDS) CAPS, Take 1 capsule by mouth 2 (two) times daily., Disp: , Rfl:  .  Omega-3 Fatty Acids (FISH OIL) 1000 MG CAPS, Take 1 capsule by mouth 2 (two) times daily., Disp: , Rfl:  .  vitamin B-12 (CYANOCOBALAMIN) 1000 MCG tablet, Take 1,000 mcg by mouth daily., Disp: , Rfl:  .  mirtazapine (REMERON) 7.5 MG tablet, Take 1 tablet (7.5 mg total) by mouth at bedtime., Disp: 90 tablet, Rfl: 1  EXAM:  VITALS per patient if applicable:  GENERAL: alert, oriented, appears well and in no acute distress  HEENT: atraumatic, conjunttiva clear, no obvious abnormalities on inspection of external nose and ears  NECK:  normal movements of the head and neck  LUNGS: on inspection no signs of respiratory distress, breathing rate appears normal, no obvious gross SOB, gasping or wheezing  CV: no obvious cyanosis  MS: moves all visible extremities without noticeable abnormality  PSYCH/NEURO: pleasant and cooperative, no obvious depression or anxiety, speech and thought processing grossly intact  ASSESSMENT AND PLAN:  Discussed the following assessment and plan:  Major depressive disorder, single episode, mild (HCC)  Major depressive disorder, single episode, mild (HCC) Symptoms of "uselesness"  May have been precipidated  by fecnet Move form I/L to memor care ement. Starting sertraline at 25 mg daiy with plans to increase dose to 50 mg after one wek  Follow up in 3 to 4 weeks     I discussed the assessment and treatment plan with the patient. The patient was provided an opportunity to ask questions and all were answered. The patient agreed with the plan and demonstrated an understanding of the instructions.   The patient was advised to call back or seek an in-person evaluation if the symptoms worsen or if the condition fails to improve as anticipated.  I provided 30 minutes of -face-to-face time during this encounter.   Crecencio Mc, MD

## 2020-06-24 DIAGNOSIS — F320 Major depressive disorder, single episode, mild: Secondary | ICD-10-CM | POA: Insufficient documentation

## 2020-06-24 NOTE — Assessment & Plan Note (Addendum)
Symptoms of "uselesness"  May have been precipidated by fecnet Move form I/L to memor care ement. Starting sertraline at 25 mg daiy with plans to increase dose to 50 mg after one wek  Follow up in 3 to 4 weeks

## 2020-06-27 ENCOUNTER — Telehealth: Payer: Self-pay | Admitting: Internal Medicine

## 2020-06-27 NOTE — Telephone Encounter (Signed)
Form has been placed in red folder for completion and signature. Luellen Pucker at [facility] stated that they need this before Monday because pt is moving into memory care unit on Monday.   Fax # [phone]

## 2020-06-27 NOTE — Telephone Encounter (Signed)
Jo Burke from [facility] called stated that patient needs a FL-2 form filled out and signed before Monday she is moving on Monday [name] contact number is [phone]

## 2020-06-28 NOTE — Telephone Encounter (Signed)
Form has been faxed.

## 2020-06-28 NOTE — Telephone Encounter (Signed)
Form has been completed and sent back to you

## 2020-07-04 DIAGNOSIS — D519 Vitamin B12 deficiency anemia, unspecified: Secondary | ICD-10-CM

## 2020-07-04 DIAGNOSIS — H353292 Exudative age-related macular degeneration, unspecified eye, with inactive choroidal neovascularization: Secondary | ICD-10-CM

## 2020-07-04 DIAGNOSIS — E441 Mild protein-calorie malnutrition: Secondary | ICD-10-CM

## 2020-07-04 DIAGNOSIS — F0150 Vascular dementia without behavioral disturbance: Secondary | ICD-10-CM

## 2020-07-04 DIAGNOSIS — E871 Hypo-osmolality and hyponatremia: Secondary | ICD-10-CM

## 2020-07-04 DIAGNOSIS — F39 Unspecified mood [affective] disorder: Secondary | ICD-10-CM

## 2020-07-16 DIAGNOSIS — M25552 Pain in left hip: Secondary | ICD-10-CM | POA: Diagnosis not present

## 2020-07-27 DIAGNOSIS — B351 Tinea unguium: Secondary | ICD-10-CM | POA: Diagnosis not present

## 2020-09-12 DIAGNOSIS — B351 Tinea unguium: Secondary | ICD-10-CM | POA: Diagnosis not present

## 2020-10-10 DIAGNOSIS — E43 Unspecified severe protein-calorie malnutrition: Secondary | ICD-10-CM | POA: Diagnosis not present

## 2020-10-10 DIAGNOSIS — H353290 Exudative age-related macular degeneration, unspecified eye, stage unspecified: Secondary | ICD-10-CM | POA: Diagnosis not present

## 2020-10-10 DIAGNOSIS — D519 Vitamin B12 deficiency anemia, unspecified: Secondary | ICD-10-CM | POA: Diagnosis not present

## 2020-10-10 DIAGNOSIS — F39 Unspecified mood [affective] disorder: Secondary | ICD-10-CM | POA: Diagnosis not present

## 2020-10-10 DIAGNOSIS — I872 Venous insufficiency (chronic) (peripheral): Secondary | ICD-10-CM | POA: Diagnosis not present

## 2020-10-10 DIAGNOSIS — F0150 Vascular dementia without behavioral disturbance: Secondary | ICD-10-CM | POA: Diagnosis not present

## 2020-10-10 DIAGNOSIS — G479 Sleep disorder, unspecified: Secondary | ICD-10-CM | POA: Diagnosis not present

## 2020-11-19 DIAGNOSIS — I872 Venous insufficiency (chronic) (peripheral): Secondary | ICD-10-CM | POA: Diagnosis not present

## 2020-11-19 DIAGNOSIS — E538 Deficiency of other specified B group vitamins: Secondary | ICD-10-CM | POA: Diagnosis not present

## 2020-11-19 DIAGNOSIS — D519 Vitamin B12 deficiency anemia, unspecified: Secondary | ICD-10-CM | POA: Diagnosis not present

## 2020-12-10 DIAGNOSIS — E441 Mild protein-calorie malnutrition: Secondary | ICD-10-CM | POA: Diagnosis not present

## 2020-12-10 DIAGNOSIS — F39 Unspecified mood [affective] disorder: Secondary | ICD-10-CM | POA: Diagnosis not present

## 2020-12-10 DIAGNOSIS — M159 Polyosteoarthritis, unspecified: Secondary | ICD-10-CM | POA: Diagnosis not present

## 2020-12-10 DIAGNOSIS — F0150 Vascular dementia without behavioral disturbance: Secondary | ICD-10-CM | POA: Diagnosis not present

## 2020-12-10 DIAGNOSIS — I871 Compression of vein: Secondary | ICD-10-CM | POA: Diagnosis not present

## 2021-01-25 DIAGNOSIS — D519 Vitamin B12 deficiency anemia, unspecified: Secondary | ICD-10-CM | POA: Diagnosis not present

## 2021-01-25 DIAGNOSIS — G479 Sleep disorder, unspecified: Secondary | ICD-10-CM | POA: Diagnosis not present

## 2021-01-25 DIAGNOSIS — E43 Unspecified severe protein-calorie malnutrition: Secondary | ICD-10-CM | POA: Diagnosis not present

## 2021-01-25 DIAGNOSIS — F39 Unspecified mood [affective] disorder: Secondary | ICD-10-CM | POA: Diagnosis not present

## 2021-01-25 DIAGNOSIS — M1990 Unspecified osteoarthritis, unspecified site: Secondary | ICD-10-CM | POA: Diagnosis not present

## 2021-01-25 DIAGNOSIS — H353290 Exudative age-related macular degeneration, unspecified eye, stage unspecified: Secondary | ICD-10-CM | POA: Diagnosis not present

## 2021-01-25 DIAGNOSIS — R682 Dry mouth, unspecified: Secondary | ICD-10-CM | POA: Diagnosis not present

## 2021-01-25 DIAGNOSIS — F0150 Vascular dementia without behavioral disturbance: Secondary | ICD-10-CM | POA: Diagnosis not present

## 2021-01-25 DIAGNOSIS — I872 Venous insufficiency (chronic) (peripheral): Secondary | ICD-10-CM | POA: Diagnosis not present

## 2021-01-30 DIAGNOSIS — M79621 Pain in right upper arm: Secondary | ICD-10-CM | POA: Diagnosis not present

## 2021-01-30 DIAGNOSIS — M25511 Pain in right shoulder: Secondary | ICD-10-CM | POA: Diagnosis not present

## 2021-02-06 DIAGNOSIS — K1230 Oral mucositis (ulcerative), unspecified: Secondary | ICD-10-CM | POA: Diagnosis not present

## 2021-04-04 DIAGNOSIS — F39 Unspecified mood [affective] disorder: Secondary | ICD-10-CM | POA: Diagnosis not present

## 2021-04-04 DIAGNOSIS — M159 Polyosteoarthritis, unspecified: Secondary | ICD-10-CM | POA: Diagnosis not present

## 2021-04-04 DIAGNOSIS — F0150 Vascular dementia without behavioral disturbance: Secondary | ICD-10-CM | POA: Diagnosis not present

## 2021-04-04 DIAGNOSIS — E441 Mild protein-calorie malnutrition: Secondary | ICD-10-CM | POA: Diagnosis not present

## 2021-04-22 DIAGNOSIS — Z741 Need for assistance with personal care: Secondary | ICD-10-CM | POA: Diagnosis not present

## 2021-04-22 DIAGNOSIS — H3530 Unspecified macular degeneration: Secondary | ICD-10-CM | POA: Diagnosis not present

## 2021-04-22 DIAGNOSIS — R262 Difficulty in walking, not elsewhere classified: Secondary | ICD-10-CM | POA: Diagnosis not present

## 2021-04-22 DIAGNOSIS — E559 Vitamin D deficiency, unspecified: Secondary | ICD-10-CM | POA: Diagnosis not present

## 2021-04-22 DIAGNOSIS — R2681 Unsteadiness on feet: Secondary | ICD-10-CM | POA: Diagnosis not present

## 2021-04-22 DIAGNOSIS — R4189 Other symptoms and signs involving cognitive functions and awareness: Secondary | ICD-10-CM | POA: Diagnosis not present

## 2021-04-22 DIAGNOSIS — R278 Other lack of coordination: Secondary | ICD-10-CM | POA: Diagnosis not present

## 2021-04-22 DIAGNOSIS — M5440 Lumbago with sciatica, unspecified side: Secondary | ICD-10-CM | POA: Diagnosis not present

## 2021-04-22 DIAGNOSIS — D519 Vitamin B12 deficiency anemia, unspecified: Secondary | ICD-10-CM | POA: Diagnosis not present

## 2021-05-22 DIAGNOSIS — Z741 Need for assistance with personal care: Secondary | ICD-10-CM | POA: Diagnosis not present

## 2021-05-22 DIAGNOSIS — R262 Difficulty in walking, not elsewhere classified: Secondary | ICD-10-CM | POA: Diagnosis not present

## 2021-05-22 DIAGNOSIS — D519 Vitamin B12 deficiency anemia, unspecified: Secondary | ICD-10-CM | POA: Diagnosis not present

## 2021-05-22 DIAGNOSIS — H3530 Unspecified macular degeneration: Secondary | ICD-10-CM | POA: Diagnosis not present

## 2021-05-22 DIAGNOSIS — M5440 Lumbago with sciatica, unspecified side: Secondary | ICD-10-CM | POA: Diagnosis not present

## 2021-05-22 DIAGNOSIS — R278 Other lack of coordination: Secondary | ICD-10-CM | POA: Diagnosis not present

## 2021-05-22 DIAGNOSIS — E559 Vitamin D deficiency, unspecified: Secondary | ICD-10-CM | POA: Diagnosis not present

## 2021-05-22 DIAGNOSIS — R2681 Unsteadiness on feet: Secondary | ICD-10-CM | POA: Diagnosis not present

## 2021-05-22 DIAGNOSIS — R4189 Other symptoms and signs involving cognitive functions and awareness: Secondary | ICD-10-CM | POA: Diagnosis not present

## 2021-06-05 DIAGNOSIS — G479 Sleep disorder, unspecified: Secondary | ICD-10-CM

## 2021-06-05 DIAGNOSIS — I872 Venous insufficiency (chronic) (peripheral): Secondary | ICD-10-CM | POA: Diagnosis not present

## 2021-06-05 DIAGNOSIS — F39 Unspecified mood [affective] disorder: Secondary | ICD-10-CM

## 2021-06-05 DIAGNOSIS — E43 Unspecified severe protein-calorie malnutrition: Secondary | ICD-10-CM

## 2021-06-05 DIAGNOSIS — F0150 Vascular dementia without behavioral disturbance: Secondary | ICD-10-CM

## 2021-06-05 DIAGNOSIS — M1990 Unspecified osteoarthritis, unspecified site: Secondary | ICD-10-CM | POA: Diagnosis not present

## 2021-06-05 DIAGNOSIS — R519 Headache, unspecified: Secondary | ICD-10-CM

## 2021-06-05 DIAGNOSIS — H3530 Unspecified macular degeneration: Secondary | ICD-10-CM | POA: Diagnosis not present

## 2021-06-19 DIAGNOSIS — B351 Tinea unguium: Secondary | ICD-10-CM | POA: Diagnosis not present

## 2021-06-21 DIAGNOSIS — M6281 Muscle weakness (generalized): Secondary | ICD-10-CM | POA: Diagnosis not present

## 2021-06-21 DIAGNOSIS — R2689 Other abnormalities of gait and mobility: Secondary | ICD-10-CM | POA: Diagnosis not present

## 2021-06-21 DIAGNOSIS — R2681 Unsteadiness on feet: Secondary | ICD-10-CM | POA: Diagnosis not present

## 2021-08-19 DIAGNOSIS — F39 Unspecified mood [affective] disorder: Secondary | ICD-10-CM | POA: Diagnosis not present

## 2021-08-19 DIAGNOSIS — F0150 Vascular dementia without behavioral disturbance: Secondary | ICD-10-CM | POA: Diagnosis not present

## 2021-08-19 DIAGNOSIS — M159 Polyosteoarthritis, unspecified: Secondary | ICD-10-CM | POA: Diagnosis not present

## 2021-08-19 DIAGNOSIS — E441 Mild protein-calorie malnutrition: Secondary | ICD-10-CM | POA: Diagnosis not present

## 2021-08-19 DIAGNOSIS — H3530 Unspecified macular degeneration: Secondary | ICD-10-CM | POA: Diagnosis not present

## 2021-09-18 DIAGNOSIS — G479 Sleep disorder, unspecified: Secondary | ICD-10-CM | POA: Diagnosis not present

## 2021-09-18 DIAGNOSIS — D519 Vitamin B12 deficiency anemia, unspecified: Secondary | ICD-10-CM | POA: Diagnosis not present

## 2021-09-18 DIAGNOSIS — M1990 Unspecified osteoarthritis, unspecified site: Secondary | ICD-10-CM | POA: Diagnosis not present

## 2021-09-18 DIAGNOSIS — I872 Venous insufficiency (chronic) (peripheral): Secondary | ICD-10-CM | POA: Diagnosis not present

## 2021-09-18 DIAGNOSIS — E43 Unspecified severe protein-calorie malnutrition: Secondary | ICD-10-CM | POA: Diagnosis not present

## 2021-09-18 DIAGNOSIS — F39 Unspecified mood [affective] disorder: Secondary | ICD-10-CM | POA: Diagnosis not present

## 2021-09-18 DIAGNOSIS — H3530 Unspecified macular degeneration: Secondary | ICD-10-CM | POA: Diagnosis not present

## 2021-09-18 DIAGNOSIS — F0150 Vascular dementia without behavioral disturbance: Secondary | ICD-10-CM | POA: Diagnosis not present

## 2021-09-30 DIAGNOSIS — R509 Fever, unspecified: Secondary | ICD-10-CM

## 2021-10-07 DIAGNOSIS — F0150 Vascular dementia without behavioral disturbance: Secondary | ICD-10-CM | POA: Diagnosis not present

## 2021-10-19 DEATH — deceased
